# Patient Record
Sex: Female | Born: 1985 | Race: Asian | Hispanic: No | Marital: Married | Smoking: Never smoker
Health system: Southern US, Community
[De-identification: ages and names within clinical notes are randomized; demographics above are authoritative.]

## PROBLEM LIST (undated history)

## (undated) DIAGNOSIS — Z789 Other specified health status: Secondary | ICD-10-CM

## (undated) HISTORY — DX: Other specified health status: Z78.9

---

## 2016-07-23 NOTE — L&D Delivery Note (Signed)
Patient is a 31 y.o. now G2P2 s/p NSVD at 5222w3d, who was admitted for SOL and SROM.  She arrived in MAU complete and ruptured at unknown time per patient. Hx of C/S x1. She pushed to deliver in 25 minutes.  Cord clamping delayed by several minutes then clamped by CNM and cut by interpreter.  Placenta intact and spontaneous, bleeding large with clots- pitocin 10 units given IM and methergine 0.2mg  given IM. Clots removed by manual uterine extraction- Unasyn 3g ordered for prophylaxis.  2nd degree laceration repaired without difficulty.  Mom and baby stable prior to transfer to postpartum. She plans on breastfeeding. She is unsure for birth control.  Delivery Note At 4:32 PM a viable female was delivered via VBAC, Spontaneous (Presentation: LOA ).  APGAR: 9, 9; weight pending  .   Placenta delivered intact via Duncan, 3V Cord: with pitocin and methergine given for excessive bleeding. Placenta sent to Pathology   Anesthesia: Local lidocaine used for repair and IV fentanyl post delivery  Episiotomy: None Lacerations: 2nd degree;Perineal Suture Repair: 2.0 vicryl Est. Blood Loss (mL): 500  Mom to postpartum.  Baby to Couplet care / Skin to Skin.  Sharyon CableVeronica C Rogers CNM 06/25/2017, 5:17 PM

## 2017-06-25 ENCOUNTER — Inpatient Hospital Stay (HOSPITAL_COMMUNITY)
Admission: AD | Admit: 2017-06-25 | Discharge: 2017-06-27 | DRG: 807 | Disposition: A | Discharge status: Home / Self Care | Payer: Medicaid - Out of State | Source: Ambulatory Visit | Attending: Obstetrics and Gynecology | Admitting: Obstetrics and Gynecology

## 2017-06-25 ENCOUNTER — Other Ambulatory Visit: Payer: Self-pay

## 2017-06-25 ENCOUNTER — Encounter (HOSPITAL_COMMUNITY): Payer: Self-pay | Admitting: *Deleted

## 2017-06-25 DIAGNOSIS — Z3483 Encounter for supervision of other normal pregnancy, third trimester: Secondary | ICD-10-CM | POA: Diagnosis present

## 2017-06-25 DIAGNOSIS — Z3A36 36 weeks gestation of pregnancy: Secondary | ICD-10-CM

## 2017-06-25 DIAGNOSIS — O34219 Maternal care for unspecified type scar from previous cesarean delivery: Secondary | ICD-10-CM | POA: Diagnosis present

## 2017-06-25 LAB — CBC WITH DIFFERENTIAL/PLATELET
Basophils Absolute: 0 10*3/uL (ref 0.0–0.1)
Basophils Relative: 0 %
EOS ABS: 0 10*3/uL (ref 0.0–0.7)
Eosinophils Relative: 0 %
HEMATOCRIT: 42.5 % (ref 36.0–46.0)
HEMOGLOBIN: 14 g/dL (ref 12.0–15.0)
LYMPHS ABS: 1.7 10*3/uL (ref 0.7–4.0)
Lymphocytes Relative: 10 %
MCH: 33.1 pg (ref 26.0–34.0)
MCHC: 32.9 g/dL (ref 30.0–36.0)
MCV: 100.5 fL — AB (ref 78.0–100.0)
MONOS PCT: 3 %
Monocytes Absolute: 0.4 10*3/uL (ref 0.1–1.0)
Neutro Abs: 14.7 10*3/uL — ABNORMAL HIGH (ref 1.7–7.7)
Neutrophils Relative %: 87 %
Platelets: 212 10*3/uL (ref 150–400)
RBC: 4.23 MIL/uL (ref 3.87–5.11)
RDW: 13.6 % (ref 11.5–15.5)
WBC: 16.8 10*3/uL — AB (ref 4.0–10.5)

## 2017-06-25 LAB — RAPID HIV SCREEN (HIV 1/2 AB+AG)
HIV 1/2 Antibodies: NONREACTIVE
HIV-1 P24 Antigen - HIV24: NONREACTIVE

## 2017-06-25 LAB — HEPATITIS B SURFACE ANTIGEN: HEP B S AG: NEGATIVE

## 2017-06-25 LAB — TYPE AND SCREEN
ABO/RH(D): AB POS
ANTIBODY SCREEN: NEGATIVE

## 2017-06-25 LAB — ABO/RH: ABO/RH(D): AB POS

## 2017-06-25 MED ORDER — LACTATED RINGERS IV SOLN
INTRAVENOUS | Status: DC
  Administered 2017-06-25: 17:00:00 via INTRAVENOUS

## 2017-06-25 MED ORDER — COCONUT OIL OIL: 1.0000 "application " | TOPICAL_OIL | Status: DC | PRN

## 2017-06-25 MED ORDER — FENTANYL CITRATE (PF) 100 MCG/2ML IJ SOLN: 50.0000 ug | Freq: Once | INTRAMUSCULAR | Status: DC

## 2017-06-25 MED ORDER — SENNOSIDES-DOCUSATE SODIUM 8.6-50 MG PO TABS
2.0000 | ORAL_TABLET | ORAL | Status: DC
  Administered 2017-06-25 – 2017-06-26 (×2): 2 via ORAL
  Filled 2017-06-25 (×2): qty 2

## 2017-06-25 MED ORDER — OXYCODONE-ACETAMINOPHEN 5-325 MG PO TABS: 2.0000 | ORAL_TABLET | ORAL | Status: DC | PRN

## 2017-06-25 MED ORDER — SODIUM CHLORIDE 0.9 % IV SOLN
3.0000 g | Freq: Once | INTRAVENOUS | Status: AC
  Administered 2017-06-25: 3 g via INTRAVENOUS
  Filled 2017-06-25: qty 3

## 2017-06-25 MED ORDER — WITCH HAZEL-GLYCERIN EX PADS: 1.0000 "application " | MEDICATED_PAD | CUTANEOUS | Status: DC | PRN

## 2017-06-25 MED ORDER — ONDANSETRON HCL 4 MG/2ML IJ SOLN: 4.0000 mg | INTRAMUSCULAR | Status: DC | PRN

## 2017-06-25 MED ORDER — DIPHENHYDRAMINE HCL 25 MG PO CAPS: 25.0000 mg | ORAL_CAPSULE | Freq: Four times a day (QID) | ORAL | Status: DC | PRN

## 2017-06-25 MED ORDER — OXYTOCIN BOLUS FROM INFUSION: 500.0000 mL | Freq: Once | INTRAVENOUS | Status: DC

## 2017-06-25 MED ORDER — PRENATAL MULTIVITAMIN CH
1.0000 | ORAL_TABLET | Freq: Every day | ORAL | Status: DC
  Administered 2017-06-26: 1 via ORAL
  Filled 2017-06-25: qty 1

## 2017-06-25 MED ORDER — TETANUS-DIPHTH-ACELL PERTUSSIS 5-2.5-18.5 LF-MCG/0.5 IM SUSP
0.5000 mL | Freq: Once | INTRAMUSCULAR | Status: AC
  Administered 2017-06-26: 0.5 mL via INTRAMUSCULAR

## 2017-06-25 MED ORDER — ONDANSETRON HCL 4 MG PO TABS
4.0000 mg | ORAL_TABLET | ORAL | Status: DC | PRN
Start: 2017-06-25 — End: 2017-06-27

## 2017-06-25 MED ORDER — ACETAMINOPHEN 325 MG PO TABS: 650.0000 mg | ORAL_TABLET | ORAL | Status: DC | PRN

## 2017-06-25 MED ORDER — METHYLERGONOVINE MALEATE 0.2 MG PO TABS: 0.2000 mg | ORAL_TABLET | ORAL | Status: DC | PRN

## 2017-06-25 MED ORDER — LACTATED RINGERS IV SOLN: 500.0000 mL | INTRAVENOUS | Status: DC | PRN

## 2017-06-25 MED ORDER — LIDOCAINE HCL (PF) 1 % IJ SOLN
INTRAMUSCULAR | Status: AC
Start: 2017-06-25 — End: 2017-06-26
  Filled 2017-06-25: qty 30

## 2017-06-25 MED ORDER — ONDANSETRON HCL 4 MG/2ML IJ SOLN: 4.0000 mg | Freq: Four times a day (QID) | INTRAMUSCULAR | Status: DC | PRN

## 2017-06-25 MED ORDER — LIDOCAINE HCL (PF) 1 % IJ SOLN
30.0000 mL | INTRAMUSCULAR | Status: AC | PRN
  Administered 2017-06-25: 30 mL via SUBCUTANEOUS
  Filled 2017-06-25: qty 30

## 2017-06-25 MED ORDER — METHYLERGONOVINE MALEATE 0.2 MG/ML IJ SOLN: 0.2000 mg | INTRAMUSCULAR | Status: DC | PRN

## 2017-06-25 MED ORDER — ACETAMINOPHEN 325 MG PO TABS
650.0000 mg | ORAL_TABLET | ORAL | Status: DC | PRN
Start: 2017-06-25 — End: 2017-06-27

## 2017-06-25 MED ORDER — IBUPROFEN 600 MG PO TABS
600.0000 mg | ORAL_TABLET | Freq: Four times a day (QID) | ORAL | Status: DC
  Administered 2017-06-25 – 2017-06-27 (×6): 600 mg via ORAL
  Filled 2017-06-25 (×7): qty 1

## 2017-06-25 MED ORDER — OXYCODONE-ACETAMINOPHEN 5-325 MG PO TABS: 1.0000 | ORAL_TABLET | ORAL | Status: DC | PRN

## 2017-06-25 MED ORDER — FENTANYL CITRATE (PF) 100 MCG/2ML IJ SOLN
INTRAMUSCULAR | Status: AC
  Administered 2017-06-25: 100 ug
  Filled 2017-06-25: qty 2

## 2017-06-25 MED ORDER — OXYTOCIN 10 UNIT/ML IJ SOLN
INTRAMUSCULAR | Status: AC
  Administered 2017-06-25: 10 [IU]
  Filled 2017-06-25: qty 1

## 2017-06-25 MED ORDER — OXYTOCIN 40 UNITS IN LACTATED RINGERS INFUSION - SIMPLE MED: 2.5000 [IU]/h | INTRAVENOUS | Status: DC

## 2017-06-25 MED ORDER — SOD CITRATE-CITRIC ACID 500-334 MG/5ML PO SOLN: 30.0000 mL | ORAL | Status: DC | PRN

## 2017-06-25 MED ORDER — BENZOCAINE-MENTHOL 20-0.5 % EX AERO
1.0000 "application " | INHALATION_SPRAY | CUTANEOUS | Status: DC | PRN
  Administered 2017-06-26: 1 via TOPICAL
  Filled 2017-06-25: qty 56

## 2017-06-25 MED ORDER — ZOLPIDEM TARTRATE 5 MG PO TABS: 5.0000 mg | ORAL_TABLET | Freq: Every evening | ORAL | Status: DC | PRN

## 2017-06-25 MED ORDER — DIBUCAINE 1 % RE OINT: 1.0000 "application " | TOPICAL_OINTMENT | RECTAL | Status: DC | PRN

## 2017-06-25 MED ORDER — METHYLERGONOVINE MALEATE 0.2 MG/ML IJ SOLN
0.2000 mg | Freq: Once | INTRAMUSCULAR | Status: AC
  Administered 2017-06-25: 0.2 mg via INTRAMUSCULAR

## 2017-06-25 MED ORDER — SIMETHICONE 80 MG PO CHEW: 80.0000 mg | CHEWABLE_TABLET | ORAL | Status: DC | PRN

## 2017-06-25 NOTE — Progress Notes (Signed)
Steroids unable to be given prior to delivery due to precipitous labor and complete dilation in maternity admissions.

## 2017-06-25 NOTE — MAU Note (Signed)
Nurse called to the bathroom by patient's family member.  Pt. Bleeding.  RN escorted patient to LDR #5. Pt. Placed on EFM FHR 140s.  Per patient, due date is December 29th. Vaginal bleeding noted, RN [facility] - provider busy with other patient's and I was instructed to check patient.  SVE 100/10/-2. No provider available  (in rooms with other patients) provider overhead paged to LDR #5. While attempting to triage patient during her discomfort with labor pains, pt.'s first child was a c/section and pt[facility], pt. Travels back and forth to [place], pt. [place] not have a provider [place] in [place], [place].   [facility] and patient transferred to L&D.

## 2017-06-26 LAB — RAPID URINE DRUG SCREEN, HOSP PERFORMED
Amphetamines: NOT DETECTED
Barbiturates: NOT DETECTED
Benzodiazepines: NOT DETECTED
Cocaine: NOT DETECTED
Opiates: NOT DETECTED
Tetrahydrocannabinol: NOT DETECTED

## 2017-06-26 LAB — HIV ANTIBODY (ROUTINE TESTING W REFLEX): HIV SCREEN 4TH GENERATION: NONREACTIVE

## 2017-06-26 LAB — RPR: RPR: NONREACTIVE

## 2017-06-26 LAB — RUBELLA SCREEN: RUBELLA: 4.51 {index} (ref >0.99)

## 2017-06-26 NOTE — Progress Notes (Signed)
POSTPARTUM PROGRESS NOTE  Post Partum Day 1  Subjective:  Makayla Mcfarland is a 31 y.o. [id number] s/p VBAC at 3252w3d.  No acute events overnight.  Pt denies problems with ambulating, voiding or po intake.  She denies nausea or vomiting.  Pain is well controlled.  Lochia Small.   Objective: Blood pressure 100/63, pulse 72, temperature 98.2 F (36.8 C), temperature source Oral, resp. rate 20, height 5\' 1"  (1.549 m), weight 135 lb (61.2 kg), last menstrual period 06/25/2017, unknown if currently breastfeeding.  Physical Exam:  General: alert, cooperative and no distress Chest: no respiratory distress Heart: regular rate, distal pulses intact Abdomen: soft, nontender,  Uterine Fundus: firm, appropriately tender DVT Evaluation: No calf swelling or tenderness Extremities: no edema Skin: warm, dry  Recent Labs    06/25/17 1646  HGB 14.0  HCT 42.5    Assessment/Plan: Atha Cheri RousShan Schirtzinger is a 31 y.o. [id number] s/p VBAC at 952w3d   PPD#1 - Doing well Contraception: undecided; counseled Feeding: breast Dispo: Plan for discharge tomorrow.   LOS: 1 day   Kandra NicolasJulie P DegeleMD 06/26/2017, 10:17 AM

## 2017-06-27 MED ORDER — IBUPROFEN 600 MG PO TABS: 600.0000 mg | ORAL_TABLET | Freq: Four times a day (QID) | ORAL | 0 refills | Status: DC | PRN

## 2017-06-27 NOTE — H&P (Signed)
Makayla Mcfarland is an 31 y.o. [phone] 3036w3d female.   Chief Complaint: labor HPI: Here with contractions. Bleeding since 4 am. Pain began at 10 am. Brought to Triage and bleeding and in pain. She was complete and delivered quickly. Reports traveling frequently between [place] and here. No prenatal care. Previous c-section.  History reviewed. No pertinent past medical history.  Past Surgical History:  Procedure Laterality Date  . CESAREAN SECTION      History reviewed. No pertinent family history. Social History:  reports that  has never smoked. she has never used smokeless tobacco. She reports that she does not drink alcohol or use drugs.   No Known Allergies  No medications prior to admission.     Review of systems not obtained due to patient factors. Does not speak English  Blood pressure (!) 90/58, pulse 83, temperature 98 F (36.7 C), temperature source Oral, resp. rate 18, height 5\' 1"  (1.549 m), weight 138 lb (62.6 kg), last menstrual period 06/25/2017, unknown if currently breastfeeding. General appearance: alert, cooperative and appears stated age Head: Normocephalic, without obvious abnormality, atraumatic Neck: supple, symmetrical, trachea midline Lungs: normal effort Heart: regular rate and rhythm Abdomen: gravid, size equals dates Extremities: Homans sign is negative, no sign of DVT Skin: Skin color, texture, turgor normal. No rashes or lesions Neurologic: Grossly normal   Lab Results  Component Value Date   WBC 16.8 (H) 06/25/2017   HGB 14.0 06/25/2017   HCT 42.5 06/25/2017   MCV 100.5 (H) 06/25/2017   PLT 212 06/25/2017         ABO, Rh: --/--/AB POS, AB POS (12/04 1646)  Antibody: NEG (12/04 1646)  Rubella: 4.51 (12/04 1646)  RPR: Non Reactive (12/04 1646)  HBsAg: Negative (12/04 1646)  HIV:    GBS:       Assessment/Plan Patient Active Problem List   Diagnosis Date Noted  . Indication for care in labor or delivery 06/25/2017   Immediately delivered  see notes.  Makayla Mcfarland 06/27/2017, 4:40 PM

## 2017-06-27 NOTE — Discharge Summary (Signed)
OB Discharge Summary     Patient Name: Makayla Mcfarland DOB: Mar 20, 1986 MRN: [id number]  Date of admission: 06/25/2017 Delivering MD: Sharyon CableOGERS, VERONICA C   Date of discharge: 06/27/2017  Admitting diagnosis: 36wks CTX 5mons and bleeding in mornign 2 Intrauterine pregnancy: 2487w3d     Secondary diagnosis:  Laboring; prev C/S; preterm Additional problems: prenatal care in [place]     Discharge diagnosis: Preterm Pregnancy Delivered and VBAC                                                                                                Post partum procedures:none  Augmentation: none  Complications: None  Hospital course:  Onset of Labor With Vaginal Delivery     31 y.o. yo [id number] at 3787w3d was admitted in Active Labor on 06/25/2017. She was complete and pushing, so betamethasone unable to be given prior to delivery. Patient had a labor course remarkable for 500cc of clots delivered with placenta, requiring methergine and Pit IM, and also bimanual exam for clot removal. She received a dose of Unasyn for prophylaxis.  Membrane Rupture Time/Date:   ,06/25/2017   Intrapartum Procedures: Episiotomy: None [1]                                         Lacerations:  2nd degree [3];Perineal [11]  Patient had a delivery of a Viable infant. 06/25/2017  Information for the patient's newborn:  Ethlyn GalleryJiang, Girl Mi [[id number]][[id number]]  Delivery Method: VBAC, Spontaneous(Filed from Delivery Summary)    Pateint had an uncomplicated postpartum course.  She is ambulating, tolerating a regular diet, passing flatus, and urinating well. Patient is discharged home in stable condition on 06/27/17.   Physical exam  Vitals:   06/25/17 1930 06/26/17 0535 06/26/17 1849 06/27/17 0536  BP: 104/71 100/63 (!) 86/58 (!) 90/58  Pulse: 85 72 89 83  Resp: 18 20 20 18   Temp: 98.2 F (36.8 C)  98.3 F (36.8 C) 98 F (36.7 C)  TempSrc: Oral  Oral Oral  Weight:      Height:       General: alert and cooperative Lochia:  appropriate Uterine Fundus: firm DVT Evaluation: No evidence of DVT seen on physical exam. Labs: Lab Results  Component Value Date   WBC 16.8 (H) 06/25/2017   HGB 14.0 06/25/2017   HCT 42.5 06/25/2017   MCV 100.5 (H) 06/25/2017   PLT 212 06/25/2017   No flowsheet data found.  Discharge instruction: per After Visit Summary and "Baby and Me Booklet".  After visit meds:  Allergies as of 06/27/2017   No Known Allergies     Medication List    TAKE these medications   ibuprofen 600 MG tablet Commonly known as:  ADVIL,MOTRIN Take 1 tablet (600 mg total) by mouth every 6 (six) hours as needed.       Diet: routine diet  Activity: Advance as tolerated. Pelvic rest for 6 weeks.   Outpatient follow up:4 weeks- inbox msg sent requesting appt Follow up  Appt:No future appointments. Follow up Visit:No Follow-up on file.  Postpartum contraception: Condoms  Newborn Data: Live born female  Birth Weight: 5 lb 14.7 oz (2685 g) APGAR: 9, 9  Newborn Delivery   Birth date/time:  06/25/2017 16:32:00 Delivery type:  VBAC, Spontaneous     Baby Feeding: Breast Disposition:home with mother   06/27/2017 Cam HaiSHAW, KIMBERLY, CNM 9:44 AM

## 2017-06-27 NOTE — Discharge Instructions (Signed)

## 2017-07-09 ENCOUNTER — Encounter: Payer: Self-pay | Admitting: General Practice

## 2017-08-12 ENCOUNTER — Ambulatory Visit (INDEPENDENT_AMBULATORY_CARE_PROVIDER_SITE_OTHER): Payer: PRIVATE HEALTH INSURANCE | Admitting: Advanced Practice Midwife

## 2017-08-12 ENCOUNTER — Encounter: Payer: Self-pay | Admitting: Advanced Practice Midwife

## 2017-08-12 DIAGNOSIS — Z3009 Encounter for other general counseling and advice on contraception: Secondary | ICD-10-CM

## 2017-08-12 NOTE — Progress Notes (Signed)
Plans for birth control are condoms. States is doing well and denies any problems with anxiety/depression

## 2017-08-12 NOTE — Progress Notes (Signed)
Subjective:     Patient ID: Theo DillsSu Shan Lonsway, female   DOB: [birth year], 32 y.o.   MRN: [id number]  Interpretor used for visit.   Mykenzi Cheri RousShan Miner is a 32 y.o. G2P1102 who is SP VBAC on 06/25/17. She is bottle feeding. She was visiting from [place] and ended up having her baby here. She had prenatal care in [place]. Denies any perineal pain. She has not resumed intercourse at this time. She reports that her bleeding has stopped at this time. Babies course has been uncomplicated. She is planning to use condoms for contraception at this time.    Review of Systems  Constitutional: Negative for chills and fever.  Gastrointestinal: Negative for nausea and vomiting.  Genitourinary: Negative for pelvic pain and vaginal bleeding.       Objective:   Physical Exam  Constitutional: She is oriented to person, place, and time. She appears well-developed and well-nourished. No distress.  HENT:  Head: Normocephalic.  Cardiovascular: Normal rate.  Pulmonary/Chest: Effort normal.  Abdominal: Soft. There is no tenderness. There is no rebound.  Neurological: She is alert and oriented to person, place, and time.  Skin: Skin is warm and dry.  Psychiatric: She has a normal mood and affect.  Nursing note and vitals reviewed.      Assessment:     1. Postpartum care and examination   2. Birth control counseling        Plan:     Reviewed birth control options Not interested at this time Routine care  Thressa ShellerHeather Hernando Reali 2:38 PM 08/12/17

## 2020-07-23 NOTE — L&D Delivery Note (Addendum)
LABOR COURSE Patient is a 35 year old female G3P1102 with IUP at [redacted]w[redacted]d by ultrasound who presented with contractions for SOL. She was found to be in active labor on admission and had an uncomplicated labor course as follows: declined epidural; did not require any augmentation of labor and progressed to complete at 1356 with SROM just prior to completion.   Delivery Note Called to room and patient was complete and pushing. Head delivered spontaneously. No nuchal cord present. Shoulder and body delivered in usual fashion. At 1401 a viable and healthy female was delivered via Vaginal, Spontaneous VBAC (Presentation: vertex;  position ROA).  Infant with spontaneous cry, placed on mother's abdomen, dried and stimulated. Cord clamped x 2 after 1.5-minute delay, and cut by provider. Cord blood drawn. Placenta delivered spontaneously with gentle cord traction. Appears intact. Fundus firm with massage and Pitocin. Labia, perineum, vagina, and cervix inspected with second degree perineal laceration noted.    APGAR: 8, 9; weight pending.   Cord: 3VC with the following complications: short.   Cord pH: n/a  Delivery performed with Dr. Salvadore Dom, DO under supervision of Cam Hai, CNM.  Anesthesia: Lidocaine  Episiotomy: None Lacerations: 2nd degree perineal Suture Repair: 3.0 vicryl Est. Blood Loss (mL): 150 mL  Repair performed by Dr. Salvadore Dom, DO with my assistance under supervision of Cam Hai, CNM.  Mom to postpartum.  Baby to Couplet care / Skin to Skin.   [name], MD 05/16/21 3:03 PM    Patient is a [id number] at [redacted]w[redacted]d who was admitted in SOL, significant hx of prev C/S with subsequent VBAC but otherwise uncomplicated prenatal course.  She progressed without augmentation.  I was gloved and present for delivery in its entirety.  Second stage of labor progressed, baby delivered after pushing x 5 mins.  No decels during second stage noted.  Complications: none  Lacerations: 2nd  deg perineal  EBL: 150cc  Makayla Mcfarland, CNM 6:32 PM 05/16/2021

## 2020-09-26 ENCOUNTER — Ambulatory Visit (INDEPENDENT_AMBULATORY_CARE_PROVIDER_SITE_OTHER): Payer: Medicaid Other | Admitting: *Deleted

## 2020-09-26 ENCOUNTER — Other Ambulatory Visit: Payer: Self-pay

## 2020-09-26 ENCOUNTER — Inpatient Hospital Stay (HOSPITAL_COMMUNITY)
Admission: AD | Admit: 2020-09-26 | Discharge: 2020-09-26 | Disposition: A | Discharge status: Home / Self Care | Payer: Medicaid Other | Attending: Family Medicine | Admitting: Family Medicine

## 2020-09-26 DIAGNOSIS — Z711 Person with feared health complaint in whom no diagnosis is made: Secondary | ICD-10-CM

## 2020-09-26 DIAGNOSIS — Z3200 Encounter for pregnancy test, result unknown: Secondary | ICD-10-CM | POA: Diagnosis not present

## 2020-09-26 DIAGNOSIS — Z049 Encounter for examination and observation for unspecified reason: Secondary | ICD-10-CM | POA: Diagnosis not present

## 2020-09-26 LAB — POCT PREGNANCY, URINE: Preg Test, Ur: POSITIVE — AB

## 2020-09-26 NOTE — Progress Notes (Addendum)
Pt submitted urine for pregnancy test and requested to be called with results. I called pt an informed her of +UPT. She reports LMP 07/30/20 which yields EDD 05/06/21, now [redacted]w[redacted]d. Pt was advised to start taking prenatal vitamins. She will be contacted with prenatal care appointments which will begin in 2-4 weeks. She should go to MAU if she develops heavy vaginal bleeding or abdominal pain. Pt voiced understanding of information and instructions given.   Nolene Bernheim, RN, MSN, NP-BC Nurse Practitioner, [facility] for [employer], [facility] 09/26/2020 5:43 PM

## 2020-09-26 NOTE — MAU Note (Signed)
Presents requesting pregnancy confirmation and wanting to know due date.  Reports had +HPT.  Denies VB.  LMP 07/30/2020.

## 2020-09-26 NOTE — MAU Provider Note (Signed)
Event Date/Time  First Provider Initiated Contact with Patient 09/26/20 1020     S Ms. Makayla Mcfarland is a 35 y.o. [phone] patient who presents to MAU today for pregnancy confirmation and to request her due date. She endorses one negative home pregnancy test and one positive home pregnancy test. She denies abdominal pain, vaginal bleeding, dysuria, fever or recent illness.  O BP 103/70 (BP Location: Right Arm)   Pulse 95   Temp 98.1 F (36.7 C) (Oral)   Resp 20   Wt 54.4 kg   LMP 07/30/2020   SpO2 99%   BMI 22.67 kg/m    Physical Exam Vitals and nursing note reviewed. Exam conducted with a chaperone present.  Constitutional:      Appearance: Normal appearance.  Cardiovascular:     Rate and Rhythm: Normal rate.     Pulses: Normal pulses.  Pulmonary:     Effort: Pulmonary effort is normal.  Abdominal:     General: Abdomen is flat.  Skin:    Capillary Refill: Capillary refill takes less than 2 seconds.  Neurological:     Mental Status: She is alert and oriented to person, place, and time.  Psychiatric:        Mood and Affect: Mood normal.        Behavior: Behavior normal.        Thought Content: Thought content normal.        Judgment: Judgment normal.    A Medical screening exam complete No acute obstetric concerns Discussed indications for pregnancy confirmation in MAU, not present at this time  P Discharge from MAU in stable condition Address for two closest [facility] offices put into patient's mapping app, patient may present for walk-in Warning signs for worsening condition that would warrant emergency follow-up discussed Patient may return to MAU as needed   Calvert Cantor, [place] 09/26/2020 10:32 AM

## 2020-10-06 ENCOUNTER — Telehealth (INDEPENDENT_AMBULATORY_CARE_PROVIDER_SITE_OTHER): Payer: Medicaid Other

## 2020-10-06 DIAGNOSIS — Z3480 Encounter for supervision of other normal pregnancy, unspecified trimester: Secondary | ICD-10-CM | POA: Insufficient documentation

## 2020-10-06 MED ORDER — BLOOD PRESSURE MONITORING DEVI: 1.0000 | 0 refills | Status: DC

## 2020-10-06 NOTE — Progress Notes (Signed)
New OB Intake  I connected with  Makayla Mcfarland on 10/06/20 at  2:15 PM EDT by telephone and verified that I am speaking with the correct person using two identifiers. Nurse is located at [facility] and pt is located at home.  I discussed the limitations, risks, security and privacy concerns of performing an evaluation and management service by telephone and the availability of in person appointments. I also discussed with the patient that there may be a patient responsible charge related to this service. The patient expressed understanding and agreed to proceed.  I explained I am completing New OB Intake today. We discussed her EDD of 05/06/21 that is based on LMP of 07/30/20. Pt is G3/P1. I reviewed her allergies, medications, Medical/Surgical/OB history, and appropriate screenings. I informed her of [facility] services. Based on history, this is a/an uncomplicated pregnancy.  Patient Active Problem List   Diagnosis Date Noted  . Indication for care in labor or delivery 06/25/2017     Concerns addressed today  Delivery Plans:  Plans to deliver at [facility].   MyChart/Babyscripts MyChart access verified. I explained pt will have some visits in office and some virtually. Babyscripts instructions given. Account successfully created and app downloaded.  Blood Pressure Cuff Blood pressure cuff ordered for patient to pick-up from [employer]. Explained after first prenatal appt pt will check weekly and document in Babyscripts.  Anatomy US Explained first scheduled [place] will be around 19 weeks. Anatomy US scheduled for 12/12/20 at 8:30a.   Labs Discussed Avelina Laine genetic screening with patient. Would like both Panorama and Horizon drawn at new OB visit. Routine prenatal labs needed.  Covid Vaccine Patient has had the covid vaccine.   [facility] Referral Patient is interested in referral to [facility].    First visit review I reviewed new OB appt with pt. I explained she will have a pelvic exam, ob bloodwork  with genetic screening, and PAP smear. Explained pt will be seen by Dr. Mart Piggs on 10/17/20@ 8:55a at first visit; encounter routed to appropriate provider. If new patient offered monthly Zoom meeting  Makayla Mcfarland, [facility] 10/06/2020  2:32 PM

## 2020-10-06 NOTE — Progress Notes (Signed)
New OB Intake done by telephone via Caregility using Mandarin Interprter Toni id# [phone]. Pt could not log into My Chart.

## 2020-10-17 ENCOUNTER — Encounter: Payer: Self-pay | Admitting: Family Medicine

## 2020-10-17 ENCOUNTER — Ambulatory Visit (INDEPENDENT_AMBULATORY_CARE_PROVIDER_SITE_OTHER): Payer: Medicaid Other | Admitting: Family Medicine

## 2020-10-17 ENCOUNTER — Other Ambulatory Visit (HOSPITAL_COMMUNITY)
Admission: RE | Admit: 2020-10-17 | Discharge: 2020-10-17 | Disposition: A | Discharge status: Home / Self Care | Payer: Medicaid Other | Source: Ambulatory Visit | Attending: Family Medicine | Admitting: Family Medicine

## 2020-10-17 ENCOUNTER — Other Ambulatory Visit: Payer: Self-pay

## 2020-10-17 DIAGNOSIS — Z23 Encounter for immunization: Secondary | ICD-10-CM

## 2020-10-17 DIAGNOSIS — Z98891 History of uterine scar from previous surgery: Secondary | ICD-10-CM | POA: Diagnosis not present

## 2020-10-17 DIAGNOSIS — Z789 Other specified health status: Secondary | ICD-10-CM | POA: Diagnosis not present

## 2020-10-17 DIAGNOSIS — Z3480 Encounter for supervision of other normal pregnancy, unspecified trimester: Secondary | ICD-10-CM | POA: Insufficient documentation

## 2020-10-17 NOTE — Progress Notes (Signed)
History:   Makayla Mcfarland is a 35 y.o. [id number] at [redacted]w[redacted]d by LMP being seen today for her first obstetrical visit.  Her obstetrical history is significant for history of cesarean section. Patient does not intend to breast feed. Pregnancy history fully reviewed.  Patient reports no complaints.      HISTORY: OB History  Gravida Para Term Preterm AB Living  3 2 1 1  0 2  SAB IAB Ectopic Multiple Live Births  0 0 0 0 2    # Outcome Date GA Lbr Len/2nd Weight Sex Delivery Anes PTL Lv  3 Current           2 Preterm 06/25/17 [redacted]w[redacted]d 05:57 / 00:35 5 lb 14.7 oz (2.685 kg) F VBAC None  LIV     Name: Makayla Mcfarland     Apgar1: 9  Apgar5: 9  1 Term 06/26/12     CS-Classical       Last pap smear was done unknown  History reviewed. No pertinent past medical history. Past Surgical History:  Procedure Laterality Date  . CESAREAN SECTION     History reviewed. No pertinent family history. Social History   Tobacco Use  . Smoking status: Never Smoker  . Smokeless tobacco: Never Used  Substance Use Topics  . Alcohol use: No  . Drug use: No   No Known Allergies Current Outpatient Medications on File Prior to Visit  Medication Sig Dispense Refill  . Prenatal Vit-Fe Fumarate-FA (PRENATAL MULTIVITAMIN) TABS tablet Take 1 tablet by mouth daily at 12 noon.    . Blood Pressure Monitoring DEVI 1 each by Does not apply route once a week. (Patient not taking: Reported on 10/17/2020) 1 each 0   No current facility-administered medications on file prior to visit.    Review of Systems Pertinent items noted in HPI and remainder of comprehensive ROS otherwise negative. Physical Exam:   Vitals:   10/17/20 0903  BP: 107/74  Pulse: 88  Weight: 119 lb 14.4 oz (54.4 kg)     Bedside Ultrasound for FHR check: Viable intrauterine pregnancy with positive cardiac activity note, fetal heart rate 145 bpm Patient informed that the ultrasound is considered a limited obstetric ultrasound and is not intended to  be a complete ultrasound exam.  Patient also informed that the ultrasound is not being completed with the intent of assessing for fetal or placental anomalies or any pelvic abnormalities.  Explained that the purpose of today's ultrasound is to assess for fetal heart rate.  Patient acknowledges the purpose of the exam and the limitations of the study. Uterus:     Pelvic Exam: Perineum: no hemorrhoids, normal perineum   Vulva: normal external genitalia, no lesions   Vagina:  normal mucosa, normal discharge   Cervix: no lesions and normal, pap smear done.    Adnexa: normal adnexa and no mass, fullness, tenderness   Bony Pelvis: average  System: General: well-developed, well-nourished female in no acute distress   Breasts:  normal appearance, no masses or tenderness bilaterally   Skin: normal coloration and turgor, no rashes   Neurologic: oriented, normal, negative, normal mood   Extremities: normal strength, tone, and muscle mass, ROM of all joints is normal   HEENT PERRLA, extraocular movement intact and sclera clear, anicteric   Mouth/Teeth mucous membranes moist, pharynx normal without lesions and dental hygiene good   Neck supple and no masses   Cardiovascular: regular rate and rhythm   Respiratory:  no respiratory distress, normal breath sounds  Abdomen: soft, non-tender; bowel sounds normal; no masses,  no organomegaly    Assessment:    Pregnancy: [id number] Patient Active Problem List   Diagnosis Date Noted  . Language barrier 10/17/2020  . History of cesarean section 10/17/2020  . Supervision of other normal pregnancy, antepartum 10/06/2020     Plan:    1. Supervision of other normal pregnancy, antepartum -doing well without complaints -taking PNV -counseled on contraception, patient will think about options  2. Language barrier In person interpreter used for entirety of visit  3. History of cesarean section 2013 in [place], unsure of hospital name/location, unable to obtain  records. In Epic GsPs has been inputted as classical cesarean, patient denies having been told she could not TOLAC. Patient has had a successful VBAC. After discussion with Dr. Alysia Penna patient is cleared to [facility], needs consents signed.   Initial labs drawn. Continue prenatal vitamins. Problem list reviewed and updated. Genetic Screening discussed, NIPS: requested. Ultrasound discussed; fetal anatomic survey: ordered. Anticipatory guidance about prenatal visits given including labs, ultrasounds, and testing. Discussed usage of Babyscripts and virtual visits as additional source of managing and completing prenatal visits in midst of coronavirus and pandemic.   Encouraged to complete MyChart Registration for her ability to review results, send requests, and have questions addressed.  The nature of [place] [facility] Healthcare/Faculty Practice with multiple MDs and Advanced Practice Providers was explained to patient; also emphasized that residents, students are part of our team. Routine obstetric precautions reviewed. Encouraged to seek out care at office or emergency room [facility] MAU preferred) for urgent and/or emergent concerns. Return in about 4 weeks (around 11/14/2020) for LROB; in person.     Alric Seton, MD OB Fellow, Faculty [facility], Center for [facility] 10/17/2020 1:04 PM

## 2020-10-18 LAB — CBC/D/PLT+RPR+RH+ABO+RUB AB...
Antibody Screen: NEGATIVE
Basophils Absolute: 0.1 10*3/uL (ref 0.0–0.2)
Basos: 1 %
EOS (ABSOLUTE): 0.1 10*3/uL (ref 0.0–0.4)
Eos: 1 %
HCV Ab: <0.1 s/co ratio (ref 0.0–0.9)
HIV Screen 4th Generation wRfx: NONREACTIVE
Hematocrit: 38.5 % (ref 34.0–46.6)
Hemoglobin: 13.2 g/dL (ref 11.1–15.9)
Hepatitis B Surface Ag: NEGATIVE
Immature Grans (Abs): 0 10*3/uL (ref 0.0–0.1)
Immature Granulocytes: 0 %
Lymphocytes Absolute: 2.1 10*3/uL (ref 0.7–3.1)
Lymphs: 25 %
MCH: 32.3 pg (ref 26.6–33.0)
MCHC: 34.3 g/dL (ref 31.5–35.7)
MCV: 94 fL (ref 79–97)
Monocytes Absolute: 0.6 10*3/uL (ref 0.1–0.9)
Monocytes: 7 %
Neutrophils Absolute: 5.8 10*3/uL (ref 1.4–7.0)
Neutrophils: 66 %
Platelets: 251 10*3/uL (ref 150–450)
RBC: 4.09 x10E6/uL (ref 3.77–5.28)
RDW: 12 % (ref 11.7–15.4)
RPR Ser Ql: NONREACTIVE
Rh Factor: POSITIVE
Rubella Antibodies, IGG: 5.49 index (ref >0.99)
WBC: 8.5 10*3/uL (ref 3.4–10.8)

## 2020-10-18 LAB — HEMOGLOBIN A1C
Est. average glucose Bld gHb Est-mCnc: 103 mg/dL
Hgb A1c MFr Bld: 5.2 % (ref 4.8–5.6)

## 2020-10-18 LAB — HCV INTERPRETATION

## 2020-10-19 LAB — CYTOLOGY - PAP
Chlamydia: NEGATIVE
Comment: NEGATIVE
Comment: NEGATIVE
Comment: NORMAL
Diagnosis: NEGATIVE
High risk HPV: NEGATIVE
Neisseria Gonorrhea: NEGATIVE

## 2020-10-20 LAB — URINE CULTURE, OB REFLEX

## 2020-10-20 LAB — CULTURE, OB URINE

## 2020-10-31 ENCOUNTER — Encounter: Payer: Self-pay | Admitting: *Deleted

## 2020-11-01 ENCOUNTER — Encounter: Payer: Self-pay | Admitting: General Practice

## 2020-11-03 DIAGNOSIS — Z3480 Encounter for supervision of other normal pregnancy, unspecified trimester: Secondary | ICD-10-CM | POA: Diagnosis not present

## 2020-11-14 ENCOUNTER — Encounter: Payer: Medicaid Other | Admitting: Nurse Practitioner

## 2020-11-28 ENCOUNTER — Ambulatory Visit (INDEPENDENT_AMBULATORY_CARE_PROVIDER_SITE_OTHER): Payer: Medicaid Other | Admitting: Nurse Practitioner

## 2020-11-28 ENCOUNTER — Other Ambulatory Visit: Payer: Self-pay

## 2020-11-28 VITALS — BP 100/69 | HR 91 | Wt 121.5 lb

## 2020-11-28 DIAGNOSIS — Z3480 Encounter for supervision of other normal pregnancy, unspecified trimester: Secondary | ICD-10-CM | POA: Diagnosis not present

## 2020-11-28 DIAGNOSIS — Z98891 History of uterine scar from previous surgery: Secondary | ICD-10-CM

## 2020-11-28 DIAGNOSIS — Z789 Other specified health status: Secondary | ICD-10-CM

## 2020-11-28 DIAGNOSIS — Z3A17 17 weeks gestation of pregnancy: Secondary | ICD-10-CM

## 2020-11-28 NOTE — Progress Notes (Signed)
    Subjective:  Makayla Mcfarland is a 35 y.o. [phone] at [redacted]w[redacted]d being seen today for ongoing prenatal care.  She is currently monitored for the following issues for this low-risk pregnancy and has Supervision of other normal pregnancy, antepartum; Language barrier; and History of cesarean section on their problem list.  Patient reports no complaints.  Contractions: Not present. Vag. Bleeding: None.  Movement: Present. Denies leaking of fluid.   The following portions of the patient's history were reviewed and updated as appropriate: allergies, current medications, past family history, past medical history, past social history, past surgical history and problem list. Problem list updated.  Objective:   Vitals:   11/28/20 1554  BP: 100/69  Pulse: 91  Weight: 121 lb 8 oz (55.1 kg)    Fetal Status: Fetal Heart Rate (bpm): 157 Fundal Height: 17 cm Movement: Present     General:  Alert, oriented and cooperative. Patient is in no acute distress.  Skin: Skin is warm and dry. No rash noted.   Cardiovascular: Normal heart rate noted  Respiratory: Normal respiratory effort, no problems with respiration noted  Abdomen: Soft, gravid, appropriate for gestational age. Pain/Pressure: Absent     Pelvic:  Cervical exam deferred        Extremities: Normal range of motion.  Edema: None  Mental Status: Normal mood and affect. Normal behavior. Normal judgment and thought content.   Urinalysis:      Assessment and Plan:  Pregnancy: G3P1102 at [redacted]w[redacted]d  1. Supervision of other normal pregnancy, antepartum Doing well Reviewed [place] appt on 12-12-20  - AFP, Serum, Open Spina Bifida  2. Language barrier In person interpreter accompanies her for the entire visit  3. History of cesarean section    Preterm labor symptoms and general obstetric precautions including but not limited to vaginal bleeding, contractions, leaking of fluid and fetal movement were reviewed in detail with the patient. Please refer to  After Visit Summary for other counseling recommendations.  Return in about 4 weeks (around 12/26/2020) for in person ROB.  Nolene Bernheim, RN, MSN, NP-BC Nurse Practitioner, [facility] for [employer], [facility] 11/28/2020 8:41 PM

## 2020-11-30 LAB — AFP, SERUM, OPEN SPINA BIFIDA
AFP MoM: 0.45
AFP Value: 21.4 ng/mL
Gest. Age on Collection Date: 17.2 weeks
Maternal Age At EDD: 33.1 yr
OSBR Risk 1 IN: 10000
Test Results:: NEGATIVE
Weight: 121 [lb_av]

## 2020-12-12 ENCOUNTER — Other Ambulatory Visit: Payer: Self-pay

## 2020-12-12 ENCOUNTER — Other Ambulatory Visit: Payer: Self-pay | Admitting: Obstetrics and Gynecology

## 2020-12-12 ENCOUNTER — Ambulatory Visit: Payer: Medicaid Other | Attending: Obstetrics and Gynecology

## 2020-12-12 DIAGNOSIS — Z363 Encounter for antenatal screening for malformations: Secondary | ICD-10-CM

## 2020-12-12 DIAGNOSIS — O34219 Maternal care for unspecified type scar from previous cesarean delivery: Secondary | ICD-10-CM

## 2020-12-12 DIAGNOSIS — Z3A16 16 weeks gestation of pregnancy: Secondary | ICD-10-CM | POA: Diagnosis not present

## 2020-12-12 DIAGNOSIS — O09212 Supervision of pregnancy with history of pre-term labor, second trimester: Secondary | ICD-10-CM | POA: Diagnosis not present

## 2020-12-12 DIAGNOSIS — Z3480 Encounter for supervision of other normal pregnancy, unspecified trimester: Secondary | ICD-10-CM | POA: Insufficient documentation

## 2020-12-12 DIAGNOSIS — O359XX0 Maternal care for (suspected) fetal abnormality and damage, unspecified, not applicable or unspecified: Secondary | ICD-10-CM

## 2020-12-26 ENCOUNTER — Ambulatory Visit (INDEPENDENT_AMBULATORY_CARE_PROVIDER_SITE_OTHER): Payer: Medicaid Other | Admitting: Medical

## 2020-12-26 ENCOUNTER — Encounter: Payer: Self-pay | Admitting: Medical

## 2020-12-26 ENCOUNTER — Other Ambulatory Visit: Payer: Self-pay

## 2020-12-26 VITALS — BP 94/67 | HR 84 | Wt 126.4 lb

## 2020-12-26 DIAGNOSIS — Z3480 Encounter for supervision of other normal pregnancy, unspecified trimester: Secondary | ICD-10-CM

## 2020-12-26 DIAGNOSIS — Z3A18 18 weeks gestation of pregnancy: Secondary | ICD-10-CM

## 2020-12-26 DIAGNOSIS — Z98891 History of uterine scar from previous surgery: Secondary | ICD-10-CM

## 2020-12-26 DIAGNOSIS — Z789 Other specified health status: Secondary | ICD-10-CM

## 2020-12-26 NOTE — Patient Instructions (Addendum)
BP cuff is at [employer] at [employer]. Their phone number is : [phone]   Second Trimester of Pregnancy  The second trimester of pregnancy is from week 13 through week 27. This is also called months 4 through 6 of pregnancy. This is often the time when you feel your best. During the second trimester:  Morning sickness is less or has stopped.  You may have more energy.  You may feel hungry more often. At this time, your unborn baby (fetus) is growing very fast. At the end of the sixth month, the unborn baby may be up to 12 inches long and weigh about 1 pounds. You will likely start to feel the baby move between 16 and 20 weeks of pregnancy. Body changes during your second trimester Your body continues to go through many changes during this time. The changes vary and generally return to normal after the baby is born. Physical changes  You will gain more weight.  You may start to get stretch marks on your hips, belly (abdomen), and breasts.  Your breasts will grow and may hurt.  Dark spots or blotches may develop on your face.  A dark line from your belly button to the pubic area (linea nigra) may appear.  You may have changes in your hair. Health changes  You may have headaches.  You may have heartburn.  You may have trouble pooping (constipation).  You may have hemorrhoids or swollen, bulging veins (varicose veins).  Your gums may bleed.  You may pee (urinate) more often.  You may have back pain. Follow these instructions at home: Medicines  Take over-the-counter and prescription medicines only as told by your doctor. Some medicines are not safe during pregnancy.  Take a prenatal vitamin that contains at least 600 micrograms (mcg) of folic acid. Eating and drinking  Eat healthy meals that include: ? Fresh fruits and vegetables. ? Whole grains. ? Good sources of protein, such as meat, eggs, or tofu. ? Low-fat dairy products.  Avoid raw meat  and unpasteurized juice, milk, and cheese.  You may need to take these actions to prevent or treat trouble pooping: ? Drink enough fluids to keep your pee (urine) pale yellow. ? Eat foods that are high in fiber. These include beans, whole grains, and fresh fruits and vegetables. ? Limit foods that are high in fat and sugar. These include fried or sweet foods. Activity  Exercise only as told by your doctor. Most people can do their usual exercise during pregnancy. Try to exercise for 30 minutes at least 5 days a week.  Stop exercising if you have pain or cramps in your belly or lower back.  Do not exercise if it is too hot or too humid, or if you are in a place of great height (high altitude).  Avoid heavy lifting.  If you choose to, you may have sex unless your doctor tells you not to. Relieving pain and discomfort  Wear a good support bra if your breasts are sore.  Take warm water baths (sitz baths) to soothe pain or discomfort caused by hemorrhoids. Use hemorrhoid cream if your doctor approves.  Rest with your legs raised (elevated) if you have leg cramps or low back pain.  If you develop bulging veins in your legs: ? Wear support hose as told by your doctor. ? Raise your feet for 15 minutes, 3-4 times a day. ? Limit salt in your food. Safety  Wear your seat belt at all  times when you are in a car.  Talk with your doctor if someone is hurting you or yelling at you a lot. Lifestyle  Do not use hot tubs, steam rooms, or saunas.  Do not douche. Do not use tampons or scented sanitary pads.  Avoid cat litter boxes and soil used by cats. These carry germs that can harm your baby and can cause a loss of your baby by miscarriage or stillbirth.  Do not use herbal medicines, illegal drugs, or medicines that are not approved by your doctor. Do not drink alcohol.  Do not smoke or use any products that contain nicotine or tobacco. If you need help quitting, ask your doctor. General  instructions  Keep all follow-up visits. This is important.  Ask your doctor about local prenatal classes.  Ask your doctor about the right foods to eat or for help finding a counselor. Where to find more information  American Pregnancy Association: americanpregnancy.org  [employer] of Obstetricians and Gynecologists: www.acog.org  Office on [employer] Health: MightyReward.co.nz Contact a doctor if:  You have a headache that does not go away when you take medicine.  You have changes in how you see, or you see spots in front of your eyes.  You have mild cramps, pressure, or pain in your lower belly.  You continue to feel like you may vomit (nauseous), you vomit, or you have watery poop (diarrhea).  You have bad-smelling fluid coming from your vagina.  You have pain when you pee or your pee smells bad.  You have very bad swelling of your face, hands, ankles, feet, or legs.  You have a fever. Get help right away if:  You are leaking fluid from your vagina.  You have spotting or bleeding from your vagina.  You have very bad belly cramping or pain.  You have trouble breathing.  You have chest pain.  You faint.  You have not felt your baby move for the time period told by your doctor.  You have new or increased pain, swelling, or redness in an arm or leg. Summary  The second trimester of pregnancy is from week 13 through week 27 (months 4 through 6).  Eat healthy meals.  Exercise as told by your doctor. Most people can do their usual exercise during pregnancy.  Do not use herbal medicines, illegal drugs, or medicines that are not approved by your doctor. Do not drink alcohol.  Call your doctor if you get sick or if you notice anything unusual about your pregnancy. This information is not intended to replace advice given to you by your health care provider. Make sure you discuss any questions you have with your health care provider. Document Revised:  12/16/2019 Document Reviewed: 10/22/2019 Elsevier Patient Education  2021 [employer].

## 2020-12-26 NOTE — Progress Notes (Signed)
   PRENATAL VISIT NOTE  Subjective:  Makayla Mcfarland is a 35 y.o. [id number] at [redacted]w[redacted]d being seen today for ongoing prenatal care.  She is currently monitored for the following issues for this high-risk pregnancy and has Supervision of other normal pregnancy, antepartum; Language barrier; and History of cesarean section on their problem list.  Patient reports intermittent perineal swelling.  Contractions: Not present. Vag. Bleeding: None.  Movement: Present. Denies leaking of fluid.   The following portions of the patient's history were reviewed and updated as appropriate: allergies, current medications, past family history, past medical history, past social history, past surgical history and problem list.   Objective:   Vitals:   12/26/20 1535  BP: 94/67  Pulse: 84  Weight: 126 lb 6.4 oz (57.3 kg)    Fetal Status: Fetal Heart Rate (bpm): 154   Movement: Present     General:  Alert, oriented and cooperative. Patient is in no acute distress.  Skin: Skin is warm and dry. No rash noted.   Cardiovascular: Normal heart rate noted  Respiratory: Normal respiratory effort, no problems with respiration noted  Abdomen: Soft, gravid, appropriate for gestational age.  Pain/Pressure: Present     Pelvic: Cervical exam deferred        Extremities: Normal range of motion.  Edema: None  Mental Status: Normal mood and affect. Normal behavior. Normal judgment and thought content.   Assessment and Plan:  Pregnancy: G3P1102 at [redacted]w[redacted]d 1. Supervision of other normal pregnancy, antepartum - All labs up to date and discussed with patient  - Anatomy US scheduled 01/09/21  2. Language barrier - Interpreter present   3. History of cesarean section - Desires VBAC, will consent at 28 weeks   4. [redacted] weeks gestation of pregnancy  Preterm labor symptoms and general obstetric precautions including but not limited to vaginal bleeding, contractions, leaking of fluid and fetal movement were reviewed in detail with the  patient. Please refer to After Visit Summary for other counseling recommendations.   Return in about 4 weeks (around 01/23/2021) for LOB, In-Person, any provider.  Future Appointments  Date Time Provider Department Center  01/09/2021  2:30 PM [facility] NURSE [facility]  01/09/2021  2:45 PM [facility]4 [facility]  01/30/2021  1:15 PM Kathlene Cote [facility]    Vonzella Nipple, PA-C

## 2020-12-26 NOTE — Progress Notes (Signed)
Here for ob visit. Has not picked up bp cuff. States she did not know she was supposed to but will check with [facility].  C/o edema in perineal area. Legrand Como

## 2021-01-09 ENCOUNTER — Ambulatory Visit: Payer: Medicaid Other | Admitting: *Deleted

## 2021-01-09 ENCOUNTER — Other Ambulatory Visit: Payer: Self-pay

## 2021-01-09 ENCOUNTER — Ambulatory Visit: Payer: Medicaid Other | Attending: Obstetrics and Gynecology

## 2021-01-09 ENCOUNTER — Encounter: Payer: Self-pay | Admitting: *Deleted

## 2021-01-09 VITALS — BP 103/66 | HR 85

## 2021-01-09 DIAGNOSIS — O09212 Supervision of pregnancy with history of pre-term labor, second trimester: Secondary | ICD-10-CM | POA: Diagnosis not present

## 2021-01-09 DIAGNOSIS — Z3480 Encounter for supervision of other normal pregnancy, unspecified trimester: Secondary | ICD-10-CM | POA: Diagnosis present

## 2021-01-09 DIAGNOSIS — O34219 Maternal care for unspecified type scar from previous cesarean delivery: Secondary | ICD-10-CM | POA: Insufficient documentation

## 2021-01-09 DIAGNOSIS — O359XX0 Maternal care for (suspected) fetal abnormality and damage, unspecified, not applicable or unspecified: Secondary | ICD-10-CM | POA: Insufficient documentation

## 2021-01-10 ENCOUNTER — Other Ambulatory Visit: Payer: Self-pay | Admitting: *Deleted

## 2021-01-10 DIAGNOSIS — Z98891 History of uterine scar from previous surgery: Secondary | ICD-10-CM

## 2021-01-30 ENCOUNTER — Ambulatory Visit (INDEPENDENT_AMBULATORY_CARE_PROVIDER_SITE_OTHER): Payer: Medicaid Other | Admitting: Medical

## 2021-01-30 ENCOUNTER — Other Ambulatory Visit: Payer: Self-pay

## 2021-01-30 ENCOUNTER — Encounter: Payer: Self-pay | Admitting: Medical

## 2021-01-30 VITALS — BP 102/69 | HR 90 | Wt 135.6 lb

## 2021-01-30 DIAGNOSIS — Z3A23 23 weeks gestation of pregnancy: Secondary | ICD-10-CM

## 2021-01-30 DIAGNOSIS — O283 Abnormal ultrasonic finding on antenatal screening of mother: Secondary | ICD-10-CM | POA: Insufficient documentation

## 2021-01-30 DIAGNOSIS — Z98891 History of uterine scar from previous surgery: Secondary | ICD-10-CM

## 2021-01-30 DIAGNOSIS — Z3480 Encounter for supervision of other normal pregnancy, unspecified trimester: Secondary | ICD-10-CM

## 2021-01-30 DIAGNOSIS — Z789 Other specified health status: Secondary | ICD-10-CM

## 2021-01-30 NOTE — Progress Notes (Signed)
   PRENATAL VISIT NOTE  Subjective:  Makayla Mcfarland is a 35 y.o. [phone] at [redacted]w[redacted]d being seen today for ongoing prenatal care.  She is currently monitored for the following issues for this low-risk pregnancy and has Supervision of other normal pregnancy, antepartum; Language barrier; History of cesarean section; and Fetal echogenic intracardiac focus on prenatal ultrasound on their problem list.  Patient reports no complaints.  Contractions: Not present. Vag. Bleeding: None.  Movement: Present. Denies leaking of fluid.   The following portions of the patient's history were reviewed and updated as appropriate: allergies, current medications, past family history, past medical history, past social history, past surgical history and problem list.   Objective:   Vitals:   01/30/21 1336  BP: 102/69  Pulse: 90  Weight: 135 lb 9.6 oz (61.5 kg)    Fetal Status: Fetal Heart Rate (bpm): 152   Movement: Present     General:  Alert, oriented and cooperative. Patient is in no acute distress.  Skin: Skin is warm and dry. No rash noted.   Cardiovascular: Normal heart rate noted  Respiratory: Normal respiratory effort, no problems with respiration noted  Abdomen: Soft, gravid, appropriate for gestational age.  Pain/Pressure: Absent     Pelvic: Cervical exam deferred        Extremities: Normal range of motion.  Edema: None  Mental Status: Normal mood and affect. Normal behavior. Normal judgment and thought content.   Assessment and Plan:  Pregnancy: G3P1102 at [redacted]w[redacted]d 1. Supervision of other normal pregnancy, antepartum - Doing well - Anticipatory guidance for next visit discussed including 2 hour GTT, CBC, HIV, RPR and TDAP  2. History of cesarean section - History of VBAC previously - Will sign TOLAC consent at next visit, discussed with patient today   3. Language barrier - Interpreter present   4. [redacted] weeks gestation of pregnancy  5. Fetal echogenic intracardiac focus on prenatal  ultrasound - Repeat US 02/06/21 - Patient reassured of normal NIPS and AFP  Preterm labor symptoms and general obstetric precautions including but not limited to vaginal bleeding, contractions, leaking of fluid and fetal movement were reviewed in detail with the patient. Please refer to After Visit Summary for other counseling recommendations.   Return in about 4 weeks (around 02/27/2021) for LOB, 28 week labs (fasting), In-Person.  Future Appointments  Date Time Provider Department Center  02/06/2021  3:30 PM [facility] NURSE [facility]  02/06/2021  3:45 PM [facility] US5 [facility]    Vonzella Nipple, PA-C

## 2021-01-30 NOTE — Progress Notes (Signed)
No questions or concerns

## 2021-02-06 ENCOUNTER — Ambulatory Visit: Payer: Medicaid Other | Admitting: *Deleted

## 2021-02-06 ENCOUNTER — Other Ambulatory Visit: Payer: Self-pay

## 2021-02-06 ENCOUNTER — Encounter: Payer: Self-pay | Admitting: *Deleted

## 2021-02-06 ENCOUNTER — Ambulatory Visit: Payer: Medicaid Other | Attending: Obstetrics

## 2021-02-06 VITALS — BP 99/67 | HR 86

## 2021-02-06 DIAGNOSIS — O34219 Maternal care for unspecified type scar from previous cesarean delivery: Secondary | ICD-10-CM | POA: Diagnosis not present

## 2021-02-06 DIAGNOSIS — O09212 Supervision of pregnancy with history of pre-term labor, second trimester: Secondary | ICD-10-CM | POA: Diagnosis not present

## 2021-02-06 DIAGNOSIS — Z98891 History of uterine scar from previous surgery: Secondary | ICD-10-CM | POA: Insufficient documentation

## 2021-02-06 DIAGNOSIS — Z3A24 24 weeks gestation of pregnancy: Secondary | ICD-10-CM

## 2021-02-06 DIAGNOSIS — O283 Abnormal ultrasonic finding on antenatal screening of mother: Secondary | ICD-10-CM

## 2021-02-06 DIAGNOSIS — Z3480 Encounter for supervision of other normal pregnancy, unspecified trimester: Secondary | ICD-10-CM | POA: Insufficient documentation

## 2021-02-27 ENCOUNTER — Other Ambulatory Visit: Payer: Medicaid Other

## 2021-02-27 ENCOUNTER — Other Ambulatory Visit: Payer: Self-pay

## 2021-02-27 ENCOUNTER — Ambulatory Visit (INDEPENDENT_AMBULATORY_CARE_PROVIDER_SITE_OTHER): Payer: Medicaid Other | Admitting: Student

## 2021-02-27 ENCOUNTER — Other Ambulatory Visit: Payer: Self-pay | Admitting: General Practice

## 2021-02-27 VITALS — BP 98/68 | HR 89 | Wt 141.9 lb

## 2021-02-27 DIAGNOSIS — Z3480 Encounter for supervision of other normal pregnancy, unspecified trimester: Secondary | ICD-10-CM

## 2021-02-27 DIAGNOSIS — Z23 Encounter for immunization: Secondary | ICD-10-CM | POA: Diagnosis not present

## 2021-02-27 DIAGNOSIS — Z3A27 27 weeks gestation of pregnancy: Secondary | ICD-10-CM

## 2021-02-27 NOTE — Progress Notes (Addendum)
   PRENATAL VISIT NOTE  Subjective:  Makayla Mcfarland is a 35 y.o. [phone] at [redacted]w[redacted]d being seen today for ongoing prenatal care.  She is currently monitored for the following issues for this low-risk pregnancy and has Supervision of other normal pregnancy, antepartum; Language barrier; History of cesarean section; and Fetal echogenic intracardiac focus on prenatal ultrasound on their problem list.  Patient reports no complaints. She has questions about birth control, 2 hour results, location of hospital and VBAC.  Contractions: Not present. Vag. Bleeding: None.  Movement: Present. Denies leaking of fluid.   The following portions of the patient's history were reviewed and updated as appropriate: allergies, current medications, past family history, past medical history, past social history, past surgical history and problem list.   Objective:   Vitals:   02/27/21 0956  BP: 98/68  Pulse: 89  Weight: 141 lb 14.4 oz (64.4 kg)    Fetal Status: Fetal Heart Rate (bpm): 152 Fundal Height: 26 cm Movement: Present     General:  Alert, oriented and cooperative. Patient is in no acute distress.  Skin: Skin is warm and dry. No rash noted.   Cardiovascular: Normal heart rate noted  Respiratory: Normal respiratory effort, no problems with respiration noted  Abdomen: Soft, gravid, appropriate for gestational age.  Pain/Pressure: Absent     Pelvic: Cervical exam deferred        Extremities: Normal range of motion.  Edema: None  Mental Status: Normal mood and affect. Normal behavior. Normal judgment and thought content.   Assessment and Plan:  Pregnancy: G3P1102 at [redacted]w[redacted]d 1. [redacted] weeks gestation of pregnancy -2 hour GTT in process  -signed VBAC today; patient signed today; she is an excellent candidate as she has had a successful   -she is interested in depo; she is also interested in BTL. Discussed importance of signing 4 weeks prior-she will talk to husband and discuss Directions given to [facility] Reviewed  next steps if patient has GDM - Tdap vaccine greater than or equal to 7yo IM   Live Interpreter Anette Riedel used during this encounter, including signing of VBAC form.  Preterm labor symptoms and general obstetric precautions including but not limited to vaginal bleeding, contractions, leaking of fluid and fetal movement were reviewed in detail with the patient. Please refer to After Visit Summary for other counseling recommendations.   Return in about 2 weeks (around 03/13/2021), or LROB.  Future Appointments  Date Time Provider Department Center  03/13/2021 10:15 AM Currie Paris, NP [facility]    Charlesetta Garibaldi [place], [place]

## 2021-02-27 NOTE — Patient Instructions (Signed)
Location of [facility] and women's care services located on the [place] side of [facility] [place]. [facility] (Entrance [address] off [address] and the intersection of [place] and [employer]. Valet service is available 24/7/365.   Women's and [employer]

## 2021-02-28 LAB — CBC
Hematocrit: 35.4 % (ref 34.0–46.6)
Hemoglobin: 11.8 g/dL (ref 11.1–15.9)
MCH: 31.6 pg (ref 26.6–33.0)
MCHC: 33.3 g/dL (ref 31.5–35.7)
MCV: 95 fL (ref 79–97)
Platelets: 247 10*3/uL (ref 150–450)
RBC: 3.74 x10E6/uL — ABNORMAL LOW (ref 3.77–5.28)
RDW: 12.2 % (ref 11.7–15.4)
WBC: 9.6 10*3/uL (ref 3.4–10.8)

## 2021-02-28 LAB — GLUCOSE TOLERANCE, 2 HOURS W/ 1HR
Glucose, 1 hour: 168 mg/dL (ref 65–179)
Glucose, 2 hour: 141 mg/dL (ref 65–152)
Glucose, Fasting: 69 mg/dL (ref 65–91)

## 2021-02-28 LAB — HIV ANTIBODY (ROUTINE TESTING W REFLEX): HIV Screen 4th Generation wRfx: NONREACTIVE

## 2021-02-28 LAB — RPR: RPR Ser Ql: NONREACTIVE

## 2021-03-13 ENCOUNTER — Other Ambulatory Visit: Payer: Self-pay

## 2021-03-13 ENCOUNTER — Ambulatory Visit (INDEPENDENT_AMBULATORY_CARE_PROVIDER_SITE_OTHER): Payer: Medicaid Other | Admitting: Nurse Practitioner

## 2021-03-13 VITALS — BP 101/68 | HR 93 | Wt 146.1 lb

## 2021-03-13 DIAGNOSIS — Z789 Other specified health status: Secondary | ICD-10-CM

## 2021-03-13 DIAGNOSIS — Z3480 Encounter for supervision of other normal pregnancy, unspecified trimester: Secondary | ICD-10-CM

## 2021-03-13 DIAGNOSIS — Z98891 History of uterine scar from previous surgery: Secondary | ICD-10-CM

## 2021-03-13 DIAGNOSIS — Z3A29 29 weeks gestation of pregnancy: Secondary | ICD-10-CM

## 2021-03-13 NOTE — Progress Notes (Signed)
    Subjective:  Makayla Mcfarland is a 35 y.o. G3P1102 at [redacted]w[redacted]d being seen today for ongoing prenatal care.  She is currently monitored for the following issues for this low-risk pregnancy and has Supervision of other normal pregnancy, antepartum; Language barrier; History of cesarean section; and Fetal echogenic intracardiac focus on prenatal ultrasound on their problem list.  Patient reports no complaints.  Contractions: Not present. Vag. Bleeding: None.  Movement: Present. Denies leaking of fluid.   The following portions of the patient's history were reviewed and updated as appropriate: allergies, current medications, past family history, past medical history, past social history, past surgical history and problem list. Problem list updated.  Objective:   Vitals:   03/13/21 1025  BP: 101/68  Pulse: 93  Weight: 146 lb 1.6 oz (66.3 kg)    Fetal Status: Fetal Heart Rate (bpm): 153 Fundal Height: 29 cm Movement: Present     General:  Alert, oriented and cooperative. Patient is in no acute distress.  Skin: Skin is warm and dry. No rash noted.   Cardiovascular: Normal heart rate noted  Respiratory: Normal respiratory effort, no problems with respiration noted  Abdomen: Soft, gravid, appropriate for gestational age. Pain/Pressure: Absent     Pelvic:  Cervical exam deferred        Extremities: Normal range of motion.  Edema: None  Mental Status: Normal mood and affect. Normal behavior. Normal judgment and thought content.   Urinalysis:      Assessment and Plan:  Pregnancy: G3P1102 at [redacted]w[redacted]d  1. Supervision of other normal pregnancy, antepartum Doing well.  Baby is moving well Still considering BTL - discussed signing consent at next visit if considering at all.  Signing consent does not mean BTL will be done.  Signing consent is just for insurance coverage if she decides to have BTL Given info on IUD also  - reversible contraception vs BTL as permanent contraception  2. History of  cesarean section Planning TOLAC - has signed consent  3. Language barrier Has in person interpreter Understands much of the [place] conversation but interpreter helps her clarify lots of points.  4. [redacted] weeks gestation of pregnancy   Preterm labor symptoms and general obstetric precautions including but not limited to vaginal bleeding, contractions, leaking of fluid and fetal movement were reviewed in detail with the patient. Please refer to After Visit Summary for other counseling recommendations.  Return in about 2 weeks (around 03/27/2021), or in person ROB.  Nolene Bernheim, RN, MSN, NP-BC Nurse Practitioner, [facility] for [employer], [facility] 03/13/2021 12:37 PM

## 2021-04-03 ENCOUNTER — Other Ambulatory Visit: Payer: Self-pay

## 2021-04-03 ENCOUNTER — Ambulatory Visit (INDEPENDENT_AMBULATORY_CARE_PROVIDER_SITE_OTHER): Payer: Medicaid Other | Admitting: Nurse Practitioner

## 2021-04-03 VITALS — BP 102/62 | HR 91 | Wt 151.9 lb

## 2021-04-03 DIAGNOSIS — Z98891 History of uterine scar from previous surgery: Secondary | ICD-10-CM

## 2021-04-03 DIAGNOSIS — Z3480 Encounter for supervision of other normal pregnancy, unspecified trimester: Secondary | ICD-10-CM

## 2021-04-03 NOTE — Progress Notes (Signed)
    Subjective:  Makayla Mcfarland is a 35 y.o. G3P1102 at [redacted]w[redacted]d being seen today for ongoing prenatal care.  She is currently monitored for the following issues for this low-risk pregnancy and has Supervision of other normal pregnancy, antepartum; Language barrier; History of cesarean section; and Fetal echogenic intracardiac focus on prenatal ultrasound on their problem list.  Patient reports no complaints.  Contractions: Not present. Vag. Bleeding: None.  Movement: Present. Denies leaking of fluid.   The following portions of the patient's history were reviewed and updated as appropriate: allergies, current medications, past family history, past medical history, past social history, past surgical history and problem list. Problem list updated.  Objective:   Vitals:   04/03/21 1424  BP: 102/62  Pulse: 91  Weight: 151 lb 14.4 oz (68.9 kg)    Fetal Status: Fetal Heart Rate (bpm): 137 Fundal Height: 33 cm Movement: Present     General:  Alert, oriented and cooperative. Patient is in no acute distress.  Skin: Skin is warm and dry. No rash noted.   Cardiovascular: Normal heart rate noted  Respiratory: Normal respiratory effort, no problems with respiration noted  Abdomen: Soft, gravid, appropriate for gestational age. Pain/Pressure: Absent     Pelvic:  Cervical exam deferred        Extremities: Normal range of motion.  Edema: None  Mental Status: Normal mood and affect. Normal behavior. Normal judgment and thought content.   Urinalysis:      Assessment and Plan:  Pregnancy: G3P1102 at [redacted]w[redacted]d  1. Supervision of other normal pregnancy, antepartum Has decided not to have BTL - will use condoms  2. History of cesarean section Plans TOLAC  Preterm labor symptoms and general obstetric precautions including but not limited to vaginal bleeding, contractions, leaking of fluid and fetal movement were reviewed in detail with the patient. Please refer to After Visit Summary for other counseling  recommendations.  Return in about 2 weeks (around 04/17/2021) for in person ROB.  Nolene Bernheim, RN, MSN, NP-BC Nurse Practitioner, [facility] for [employer], [facility] 04/03/2021 2:46 PM

## 2021-04-24 ENCOUNTER — Encounter: Payer: Self-pay | Admitting: Medical

## 2021-04-24 ENCOUNTER — Ambulatory Visit (INDEPENDENT_AMBULATORY_CARE_PROVIDER_SITE_OTHER): Payer: Medicaid Other | Admitting: Medical

## 2021-04-24 ENCOUNTER — Other Ambulatory Visit: Payer: Self-pay

## 2021-04-24 VITALS — BP 106/70 | HR 96 | Wt 156.1 lb

## 2021-04-24 DIAGNOSIS — Z23 Encounter for immunization: Secondary | ICD-10-CM

## 2021-04-24 DIAGNOSIS — Z3A35 35 weeks gestation of pregnancy: Secondary | ICD-10-CM

## 2021-04-24 DIAGNOSIS — O283 Abnormal ultrasonic finding on antenatal screening of mother: Secondary | ICD-10-CM

## 2021-04-24 DIAGNOSIS — Z98891 History of uterine scar from previous surgery: Secondary | ICD-10-CM

## 2021-04-24 DIAGNOSIS — Z3480 Encounter for supervision of other normal pregnancy, unspecified trimester: Secondary | ICD-10-CM

## 2021-04-24 DIAGNOSIS — Z789 Other specified health status: Secondary | ICD-10-CM

## 2021-04-24 NOTE — Progress Notes (Signed)
   PRENATAL VISIT NOTE  Subjective:  Makayla Mcfarland is a 35 y.o. G3P1102 at [redacted]w[redacted]d being seen today for ongoing prenatal care.  She is currently monitored for the following issues for this low-risk pregnancy and has Supervision of other normal pregnancy, antepartum; Language barrier; History of cesarean section; and Fetal echogenic intracardiac focus on prenatal ultrasound on their problem list.  Patient reports no complaints.  Contractions: Irritability. Vag. Bleeding: None.  Movement: Present. Denies leaking of fluid.   The following portions of the patient's history were reviewed and updated as appropriate: allergies, current medications, past family history, past medical history, past social history, past surgical history and problem list.   Objective:   Vitals:   04/24/21 1552  BP: 106/70  Pulse: 96  Weight: 156 lb 1.6 oz (70.8 kg)    Fetal Status: Fetal Heart Rate (bpm): 152 Fundal Height: 34 cm Movement: Present     General:  Alert, oriented and cooperative. Patient is in no acute distress.  Skin: Skin is warm and dry. No rash noted.   Cardiovascular: Normal heart rate noted  Respiratory: Normal respiratory effort, no problems with respiration noted  Abdomen: Soft, gravid, appropriate for gestational age.  Pain/Pressure: Absent     Pelvic: Cervical exam deferred        Extremities: Normal range of motion.  Edema: None  Mental Status: Normal mood and affect. Normal behavior. Normal judgment and thought content.   Assessment and Plan:  Pregnancy: G3P1102 at [redacted]w[redacted]d 1. Supervision of other normal pregnancy, antepartum - Planning condoms for MOC  2. Fetal echogenic intracardiac focus on prenatal ultrasound - No other concerns noted on [place] - NIPS Low Risk, Neg AFP  3. Language barrier - Interpreter present, patient has good understanding   4. History of cesarean section - TOLAC signed previously   5. [redacted] weeks gestation of pregnancy  Preterm labor symptoms and general  obstetric precautions including but not limited to vaginal bleeding, contractions, leaking of fluid and fetal movement were reviewed in detail with the patient. Please refer to After Visit Summary for other counseling recommendations.   Return in about 2 weeks (around 05/08/2021) for LOB, In-Person.  No future appointments.  Vonzella Nipple, PA-C

## 2021-05-08 ENCOUNTER — Other Ambulatory Visit: Payer: Self-pay

## 2021-05-08 ENCOUNTER — Ambulatory Visit (INDEPENDENT_AMBULATORY_CARE_PROVIDER_SITE_OTHER): Payer: Medicaid Other | Admitting: Obstetrics and Gynecology

## 2021-05-08 ENCOUNTER — Other Ambulatory Visit (HOSPITAL_COMMUNITY)
Admission: RE | Admit: 2021-05-08 | Discharge: 2021-05-08 | Disposition: A | Discharge status: Home / Self Care | Payer: Medicaid Other | Source: Ambulatory Visit | Attending: Obstetrics and Gynecology | Admitting: Obstetrics and Gynecology

## 2021-05-08 VITALS — BP 113/77 | HR 100 | Wt 159.5 lb

## 2021-05-08 DIAGNOSIS — Z3480 Encounter for supervision of other normal pregnancy, unspecified trimester: Secondary | ICD-10-CM | POA: Diagnosis not present

## 2021-05-08 DIAGNOSIS — Z3A37 37 weeks gestation of pregnancy: Secondary | ICD-10-CM | POA: Insufficient documentation

## 2021-05-08 DIAGNOSIS — Z98891 History of uterine scar from previous surgery: Secondary | ICD-10-CM

## 2021-05-08 DIAGNOSIS — O34219 Maternal care for unspecified type scar from previous cesarean delivery: Secondary | ICD-10-CM | POA: Insufficient documentation

## 2021-05-08 DIAGNOSIS — O283 Abnormal ultrasonic finding on antenatal screening of mother: Secondary | ICD-10-CM

## 2021-05-08 DIAGNOSIS — Z789 Other specified health status: Secondary | ICD-10-CM

## 2021-05-08 NOTE — Progress Notes (Signed)
   PRENATAL VISIT NOTE  Subjective:  Makayla Mcfarland is a 34 y.o. G3P1102 at [redacted]w[redacted]d being seen today for ongoing prenatal care.  She is currently monitored for the following issues for this high-risk pregnancy and has Supervision of other normal pregnancy, antepartum; Language barrier; History of cesarean section; Fetal echogenic intracardiac focus on prenatal ultrasound; [redacted] weeks gestation of pregnancy; and Hx successful VBAC (vaginal birth after cesarean), currently pregnant on their problem list.  Patient doing well with no acute concerns today. She reports no complaints.  Contractions: Irritability. Vag. Bleeding: None.  Movement: Present. Denies leaking of fluid.   The following portions of the patient's history were reviewed and updated as appropriate: allergies, current medications, past family history, past medical history, past social history, past surgical history and problem list. Problem list updated.  Objective:   Vitals:   05/08/21 1447  BP: 113/77  Pulse: 100  Weight: 159 lb 8 oz (72.3 kg)    Fetal Status: Fetal Heart Rate (bpm): 143 Fundal Height: 37 cm Movement: Present  Presentation: Vertex  General:  Alert, oriented and cooperative. Patient is in no acute distress.  Skin: Skin is warm and dry. No rash noted.   Cardiovascular: Normal heart rate noted  Respiratory: Normal respiratory effort, no problems with respiration noted  Abdomen: Soft, gravid, appropriate for gestational age.  Pain/Pressure: Present     Pelvic: Cervical exam performed Dilation: 1.5 Effacement (%): 70 Station: -2  Extremities: Normal range of motion.  Edema: None  Mental Status:  Normal mood and affect. Normal behavior. Normal judgment and thought content.   Assessment and Plan:  Pregnancy: G3P1102 at [redacted]w[redacted]d  1. [redacted] weeks gestation of pregnancy   2. Supervision of other normal pregnancy, antepartum Continue routine care  - GC/Chlamydia probe amp ([place])not at [facility] - Culture, beta strep  (group b only)  3. Language barrier Live interpreter present  4. History of cesarean section   5. Fetal echogenic intracardiac focus on prenatal ultrasound   6. Hx successful VBAC (vaginal birth after cesarean), currently pregnant Pt desires TOLAC, consent previously signed  Term labor symptoms and general obstetric precautions including but not limited to vaginal bleeding, contractions, leaking of fluid and fetal movement were reviewed in detail with the patient.  Please refer to After Visit Summary for other counseling recommendations.   Return in about 1 week (around 05/15/2021) for ROB, in person.   Mariel Aloe, MD Faculty Attending Center for [facility]

## 2021-05-09 LAB — GC/CHLAMYDIA PROBE AMP (~~LOC~~) NOT AT ARMC
Chlamydia: NEGATIVE
Comment: NEGATIVE
Comment: NORMAL
Neisseria Gonorrhea: NEGATIVE

## 2021-05-12 LAB — CULTURE, BETA STREP (GROUP B ONLY): Strep Gp B Culture: NEGATIVE

## 2021-05-15 ENCOUNTER — Other Ambulatory Visit: Payer: Self-pay

## 2021-05-15 ENCOUNTER — Ambulatory Visit (INDEPENDENT_AMBULATORY_CARE_PROVIDER_SITE_OTHER): Payer: Medicaid Other | Admitting: Family Medicine

## 2021-05-15 VITALS — BP 107/71 | HR 97 | Wt 160.3 lb

## 2021-05-15 DIAGNOSIS — O283 Abnormal ultrasonic finding on antenatal screening of mother: Secondary | ICD-10-CM

## 2021-05-15 DIAGNOSIS — Z98891 History of uterine scar from previous surgery: Secondary | ICD-10-CM

## 2021-05-15 DIAGNOSIS — Z3480 Encounter for supervision of other normal pregnancy, unspecified trimester: Secondary | ICD-10-CM

## 2021-05-15 NOTE — Progress Notes (Signed)
   Subjective:  Makayla Mcfarland is a 35 y.o. G3P1102 at [redacted]w[redacted]d being seen today for ongoing prenatal care.  She is currently monitored for the following issues for this low-risk pregnancy and has Supervision of other normal pregnancy, antepartum; Language barrier; History of cesarean section; Fetal echogenic intracardiac focus on prenatal ultrasound; [redacted] weeks gestation of pregnancy; and Hx successful VBAC (vaginal birth after cesarean), currently pregnant on their problem list.  Patient reports  intermittent contractions .  Contractions: Irritability. Vag. Bleeding: None.  Movement: Present. Denies leaking of fluid.   The following portions of the patient's history were reviewed and updated as appropriate: allergies, current medications, past family history, past medical history, past social history, past surgical history and problem list. Problem list updated.  Objective:   Vitals:   05/15/21 1515  BP: 107/71  Pulse: 97  Weight: 160 lb 4.8 oz (72.7 kg)    Fetal Status: Fetal Heart Rate (bpm): 145   Movement: Present     General:  Alert, oriented and cooperative. Patient is in no acute distress.  Skin: Skin is warm and dry. No rash noted.   Cardiovascular: Normal heart rate noted  Respiratory: Normal respiratory effort, no problems with respiration noted  Abdomen: Soft, gravid, appropriate for gestational age. Pain/Pressure: Absent     Pelvic: Vag. Bleeding: None     Cervical exam performed      1.5 cm/60/-2  Extremities: Normal range of motion.  Edema: None  Mental Status: Normal mood and affect. Normal behavior. Normal judgment and thought content.    Assessment and Plan:  Pregnancy: G3P1102 at [redacted]w[redacted]d  1. Supervision of other normal pregnancy, antepartum Doing well. Having some sporadic contractions (two to three times a day). Denies any vaginal bleeding or leaking of fluid. No other concerns at this time. Cervix checked per patient preference, unchanged from last visit - follow up  in 1 week  2. Language barrier In person Mandarin interpreter present  3. History of cesarean section Desires TOLAC. Consent signed on 02/27/2021  Term labor symptoms and general obstetric precautions including but not limited to vaginal bleeding, contractions, leaking of fluid and fetal movement were reviewed in detail with the patient. Please refer to After Visit Summary for other counseling recommendations.  Return in about 1 week (around 05/22/2021) for LROB, any provider. Warner Mccreedy, MD, MPH OB Fellow, Faculty Practice

## 2021-05-16 ENCOUNTER — Encounter (HOSPITAL_COMMUNITY): Payer: Self-pay | Admitting: Obstetrics & Gynecology

## 2021-05-16 ENCOUNTER — Inpatient Hospital Stay (HOSPITAL_COMMUNITY)
Admission: AD | Admit: 2021-05-16 | Discharge: 2021-05-17 | DRG: 807 | Disposition: A | Discharge status: Home / Self Care | Payer: Medicaid Other | Attending: Family Medicine | Admitting: Family Medicine

## 2021-05-16 DIAGNOSIS — O26893 Other specified pregnancy related conditions, third trimester: Secondary | ICD-10-CM | POA: Diagnosis not present

## 2021-05-16 DIAGNOSIS — O34211 Maternal care for low transverse scar from previous cesarean delivery: Secondary | ICD-10-CM | POA: Diagnosis not present

## 2021-05-16 DIAGNOSIS — Z3A38 38 weeks gestation of pregnancy: Secondary | ICD-10-CM | POA: Diagnosis not present

## 2021-05-16 DIAGNOSIS — Z789 Other specified health status: Secondary | ICD-10-CM | POA: Diagnosis present

## 2021-05-16 DIAGNOSIS — Z98891 History of uterine scar from previous surgery: Secondary | ICD-10-CM

## 2021-05-16 DIAGNOSIS — O34219 Maternal care for unspecified type scar from previous cesarean delivery: Secondary | ICD-10-CM | POA: Diagnosis present

## 2021-05-16 DIAGNOSIS — Z20822 Contact with and (suspected) exposure to covid-19: Secondary | ICD-10-CM | POA: Diagnosis present

## 2021-05-16 DIAGNOSIS — Z3480 Encounter for supervision of other normal pregnancy, unspecified trimester: Secondary | ICD-10-CM

## 2021-05-16 DIAGNOSIS — O283 Abnormal ultrasonic finding on antenatal screening of mother: Secondary | ICD-10-CM

## 2021-05-16 DIAGNOSIS — Z603 Acculturation difficulty: Secondary | ICD-10-CM | POA: Diagnosis present

## 2021-05-16 LAB — CBC
HCT: 37.6 % (ref 36.0–46.0)
Hemoglobin: 12.4 g/dL (ref 12.0–15.0)
MCH: 30.2 pg (ref 26.0–34.0)
MCHC: 33 g/dL (ref 30.0–36.0)
MCV: 91.7 fL (ref 80.0–100.0)
Platelets: 257 10*3/uL (ref 150–400)
RBC: 4.1 MIL/uL (ref 3.87–5.11)
RDW: 13.9 % (ref 11.5–15.5)
WBC: 12 10*3/uL — ABNORMAL HIGH (ref 4.0–10.5)
nRBC: 0 % (ref 0.0–0.2)

## 2021-05-16 LAB — RESP PANEL BY RT-PCR (FLU A&B, COVID) ARPGX2
Influenza A by PCR: NEGATIVE
Influenza B by PCR: NEGATIVE
SARS Coronavirus 2 by RT PCR: NEGATIVE

## 2021-05-16 LAB — TYPE AND SCREEN
ABO/RH(D): AB POS
Antibody Screen: NEGATIVE

## 2021-05-16 MED ORDER — ONDANSETRON HCL 4 MG/2ML IJ SOLN: 4.0000 mg | INTRAMUSCULAR | Status: DC | PRN

## 2021-05-16 MED ORDER — ACETAMINOPHEN 325 MG PO TABS: 650.0000 mg | ORAL_TABLET | ORAL | Status: DC | PRN

## 2021-05-16 MED ORDER — DIPHENHYDRAMINE HCL 25 MG PO CAPS: 25.0000 mg | ORAL_CAPSULE | Freq: Four times a day (QID) | ORAL | Status: DC | PRN

## 2021-05-16 MED ORDER — WITCH HAZEL-GLYCERIN EX PADS: 1.0000 "application " | MEDICATED_PAD | CUTANEOUS | Status: DC | PRN

## 2021-05-16 MED ORDER — OXYCODONE HCL 5 MG PO TABS: 5.0000 mg | ORAL_TABLET | ORAL | Status: DC | PRN

## 2021-05-16 MED ORDER — LACTATED RINGERS IV SOLN: 500.0000 mL | INTRAVENOUS | Status: DC | PRN

## 2021-05-16 MED ORDER — LIDOCAINE HCL (PF) 1 % IJ SOLN
30.0000 mL | INTRAMUSCULAR | Status: AC | PRN
  Administered 2021-05-16: 30 mL via SUBCUTANEOUS
  Filled 2021-05-16: qty 30

## 2021-05-16 MED ORDER — FENTANYL CITRATE (PF) 100 MCG/2ML IJ SOLN
100.0000 ug | INTRAMUSCULAR | Status: DC | PRN
  Administered 2021-05-16: 100 ug via INTRAVENOUS
  Filled 2021-05-16: qty 2

## 2021-05-16 MED ORDER — SOD CITRATE-CITRIC ACID 500-334 MG/5ML PO SOLN
30.0000 mL | ORAL | Status: DC | PRN
Start: 2021-05-16 — End: 2021-05-16

## 2021-05-16 MED ORDER — OXYTOCIN-SODIUM CHLORIDE 30-0.9 UT/500ML-% IV SOLN
2.5000 [IU]/h | INTRAVENOUS | Status: DC
  Administered 2021-05-16: 2.5 [IU]/h via INTRAVENOUS
  Filled 2021-05-16 (×2): qty 500

## 2021-05-16 MED ORDER — COCONUT OIL OIL: 1.0000 "application " | TOPICAL_OIL | Status: DC | PRN

## 2021-05-16 MED ORDER — BENZOCAINE-MENTHOL 20-0.5 % EX AERO
1.0000 "application " | INHALATION_SPRAY | CUTANEOUS | Status: DC | PRN
  Administered 2021-05-16: 1 via TOPICAL
  Filled 2021-05-16: qty 56

## 2021-05-16 MED ORDER — LACTATED RINGERS IV SOLN: INTRAVENOUS | Status: DC

## 2021-05-16 MED ORDER — OXYCODONE-ACETAMINOPHEN 5-325 MG PO TABS
2.0000 | ORAL_TABLET | ORAL | Status: DC | PRN
Start: 2021-05-16 — End: 2021-05-16

## 2021-05-16 MED ORDER — ACETAMINOPHEN 325 MG PO TABS
650.0000 mg | ORAL_TABLET | ORAL | Status: DC | PRN
Start: 2021-05-16 — End: 2021-05-17

## 2021-05-16 MED ORDER — ZOLPIDEM TARTRATE 5 MG PO TABS: 5.0000 mg | ORAL_TABLET | Freq: Every evening | ORAL | Status: DC | PRN

## 2021-05-16 MED ORDER — SIMETHICONE 80 MG PO CHEW
80.0000 mg | CHEWABLE_TABLET | ORAL | Status: DC | PRN
Start: 2021-05-16 — End: 2021-05-17

## 2021-05-16 MED ORDER — TETANUS-DIPHTH-ACELL PERTUSSIS 5-2.5-18.5 LF-MCG/0.5 IM SUSY: 0.5000 mL | PREFILLED_SYRINGE | Freq: Once | INTRAMUSCULAR | Status: DC

## 2021-05-16 MED ORDER — ONDANSETRON HCL 4 MG/2ML IJ SOLN
4.0000 mg | Freq: Four times a day (QID) | INTRAMUSCULAR | Status: DC | PRN
Start: 2021-05-16 — End: 2021-05-16

## 2021-05-16 MED ORDER — DIBUCAINE (PERIANAL) 1 % EX OINT: 1.0000 "application " | TOPICAL_OINTMENT | CUTANEOUS | Status: DC | PRN

## 2021-05-16 MED ORDER — SENNOSIDES-DOCUSATE SODIUM 8.6-50 MG PO TABS
2.0000 | ORAL_TABLET | ORAL | Status: DC
  Administered 2021-05-17: 2 via ORAL
  Filled 2021-05-16: qty 2

## 2021-05-16 MED ORDER — OXYCODONE-ACETAMINOPHEN 5-325 MG PO TABS: 1.0000 | ORAL_TABLET | ORAL | Status: DC | PRN

## 2021-05-16 MED ORDER — ONDANSETRON HCL 4 MG PO TABS: 4.0000 mg | ORAL_TABLET | ORAL | Status: DC | PRN

## 2021-05-16 MED ORDER — MEASLES, MUMPS & RUBELLA VAC IJ SOLR: 0.5000 mL | Freq: Once | INTRAMUSCULAR | Status: DC

## 2021-05-16 MED ORDER — OXYTOCIN BOLUS FROM INFUSION
333.0000 mL | Freq: Once | INTRAVENOUS | Status: AC
  Administered 2021-05-16: 333 mL via INTRAVENOUS

## 2021-05-16 MED ORDER — PRENATAL MULTIVITAMIN CH
1.0000 | ORAL_TABLET | Freq: Every day | ORAL | Status: DC
  Administered 2021-05-17: 1 via ORAL
  Filled 2021-05-16: qty 1

## 2021-05-16 MED ORDER — IBUPROFEN 600 MG PO TABS
600.0000 mg | ORAL_TABLET | Freq: Four times a day (QID) | ORAL | Status: DC
  Administered 2021-05-16 – 2021-05-17 (×4): 600 mg via ORAL
  Filled 2021-05-16 (×3): qty 1

## 2021-05-16 NOTE — Progress Notes (Signed)
Contacted by nursing staff. Question about uterine scar. Patient chart documented a "classical" c/section, however, we have been unable to get records to see what type of uterine scar the patient has. The patient has had a successful VBAC and was never told she could not labor. She has a pfannensteil incision on her abdomen. In this case, I would treat the patient as if she has a uterine scar of an unknown type and will proceed with TOLAC.  Levie Heritage, DO

## 2021-05-16 NOTE — Discharge Summary (Signed)
Postpartum Discharge Summary  Date of Service updated     Patient Name: Makayla Mcfarland DOB: 1986-04-20 MRN: [id number]  Date of admission: 05/16/2021 Delivery date:05/16/2021  Delivering provider: Serita Grammes D  Date of discharge: 05/17/2021  Admitting diagnosis: Uterine contractions [O47.9] Intrauterine pregnancy: [redacted]w[redacted]d    Secondary diagnosis:  Active Problems:   Language barrier   History of cesarean section   Hx successful VBAC (vaginal birth after cesarean), currently pregnant  Additional problems: none    Discharge diagnosis: Term Pregnancy Delivered and VBAC                                              Post partum procedures: none Augmentation:  none Complications: None  Hospital course: Onset of Labor With Vaginal Delivery      35y.o. yo [id number] admitted in Active Labor on 05/16/2021. She progressed spontaneously with an uncomplicated labor course as follows:  Membrane Rupture Time/Date: 1:45 PM ,05/16/2021   Delivery Method:VBAC, Spontaneous  Episiotomy: None  Lacerations:  2nd degree  Patient had an uncomplicated postpartum course.  She is ambulating, tolerating a regular diet, passing flatus, and urinating well. Patient is discharged home in stable condition on 05/17/21.  Newborn Data: Birth date:05/16/2021  Birth time:2:01 PM  Gender:Female  Living status:Living  Apgars:8 ,9  Weight:3535 g (7lb 12.7oz)  Magnesium Sulfate received: No BMZ received: No Rhophylac:N/A MMR:N/A T-DaP:Given prenatally Flu: Yes Transfusion:No  Physical exam  Vitals:   05/16/21 1735 05/16/21 2125 05/17/21 0118 05/17/21 0525  BP: 93/63 101/68 106/68 106/82  Pulse: 72 84 76 76  Resp: 16 16 18 18   Temp: 98 F (36.7 C) 98.8 F (37.1 C) 98.3 F (36.8 C) 98.2 F (36.8 C)  TempSrc: Oral Oral Oral Oral  SpO2: 98% 98% 99% 99%  Weight:      Height:       General: alert, cooperative, and no distress Lochia: appropriate Uterine Fundus: firm Incision:  N/A DVT Evaluation: minimal edema to mid shin bilaterally  Labs: Lab Results  Component Value Date   WBC 12.0 (H) 05/16/2021   HGB 12.4 05/16/2021   HCT 37.6 05/16/2021   MCV 91.7 05/16/2021   PLT 257 05/16/2021   No flowsheet data found. Edinburgh Score: Edinburgh Postnatal Depression Scale Screening Tool 05/16/2021  I have been able to laugh and see the funny side of things. 0  I have looked forward with enjoyment to things. 0  I have blamed myself unnecessarily when things went wrong. 0  I have been anxious or worried for no good reason. 0  I have felt scared or panicky for no good reason. 0  Things have been getting on top of me. 0  I have been so unhappy that I have had difficulty sleeping. 0  I have felt sad or miserable. 0  I have been so unhappy that I have been crying. 0  The thought of harming myself has occurred to me. 0  Edinburgh Postnatal Depression Scale Total 0     After visit meds:  Allergies as of 05/17/2021   No Known Allergies      Medication List     STOP taking these medications    Blood Pressure Monitoring Devi       TAKE these medications    acetaminophen 325 MG tablet Commonly known as: Tylenol  Take 2 tablets (650 mg total) by mouth every 4 (four) hours as needed (for pain scale < 4).   ibuprofen 600 MG tablet Commonly known as: ADVIL Take 1 tablet (600 mg total) by mouth every 6 (six) hours as needed.   prenatal multivitamin Tabs tablet Take 1 tablet by mouth daily at 12 noon.         Discharge home in stable condition Infant Feeding: Bottle Infant Disposition:home with mother Discharge instruction: per After Visit Summary and Postpartum booklet. Activity: Advance as tolerated. Pelvic rest for 6 weeks.  Diet: routine diet Future Appointments: Future Appointments  Date Time Provider [place]  06/14/2021  1:35 PM Anyanwu, Sallyanne Havers, MD [facility]   Follow up Visit:  Myrtis Ser, CNM  P [facility]  Pool Please schedule this patient for Postpartum visit in: 4 weeks with the following provider: Any provider  In-Person  For C/S patients schedule nurse incision check in weeks 2 weeks: no  High risk pregnancy complicated by: Prev C/S  Delivery mode:  VBAC  Anticipated Birth Control:  Condoms  PP Procedures needed: none  Schedule Integrated BH visit: no   05/17/2021 Patriciaann Clan, DO

## 2021-05-16 NOTE — MAU Note (Signed)
.  Makayla Mcfarland is a 35 y.o. at [redacted]w[redacted]d here in MAU reporting: ctx every 20 minutes but started to have some vaginal bleeding this morning like a period. Denies LOF. Endorses good fetal movement.   Pain score: 1 Vitals:   05/16/21 0910  BP: 106/71  Pulse: 94  Resp: 15  Temp: 97.6 F (36.4 C)     FHT:140

## 2021-05-16 NOTE — H&P (Addendum)
OBSTETRIC ADMISSION HISTORY AND PHYSICAL  Makayla Mcfarland is a 35 y.o. female [phone] with IUP at [redacted]w[redacted]d by LMP presenting for SOL. She reports +FMs, No LOF, no VB, no blurry vision, headaches or peripheral edema, and RUQ pain.  She plans on breastfeeding. She request condoms for birth control. She received her prenatal care at  [facility]    Dating: By ultrasound on 12/12/20 --->  Estimated Date of Delivery: 05/27/21  Sono:    @[redacted]w[redacted]d , CWD, normal anatomy, cephalic presentation, 661 g, EFW   Prenatal History/Complications: language barrier (Mandarin speaker), fetal echogenic intracardiac focus on prenatal ultrasound (amniocentesis declined), history of prior cesarean section, and history of successful VBAC.   Past Medical History: History reviewed. No pertinent past medical history.  Past Surgical History: Past Surgical History:  Procedure Laterality Date   CESAREAN SECTION      Obstetrical History: OB History     Gravida  3   Para  2   Term  1   Preterm  1   AB  0   Living  2      SAB  0   IAB  0   Ectopic  0   Multiple  0   Live Births  2           Social History Social History   Socioeconomic History   Marital status: Married    Spouse name: Not on file   Number of children: Not on file   Years of education: Not on file   Highest education level: Not on file  Occupational History   Not on file  Tobacco Use   Smoking status: Never   Smokeless tobacco: Never  Vaping Use   Vaping Use: Never used  Substance and Sexual Activity   Alcohol use: No   Drug use: No   Sexual activity: Yes    Birth control/protection: None  Other Topics Concern   Not on file  Social History Narrative   Not on file   Social Determinants of Health   Financial Resource Strain: Not on file  Food Insecurity: No Food Insecurity   Worried About Running Out of Food in the Last Year: Never true   Ran Out of Food in the Last Year: Never true  Transportation Needs: No  Transportation Needs   Lack of Transportation (Medical): No   Lack of Transportation (Non-Medical): No  Physical Activity: Not on file  Stress: Not on file  Social Connections: Not on file    Family History: History reviewed. No pertinent family history.  Allergies: No Known Allergies  Medications Prior to Admission  Medication Sig Dispense Refill Last Dose   Prenatal Vit-Fe Fumarate-FA (PRENATAL MULTIVITAMIN) TABS tablet Take 1 tablet by mouth daily at 12 noon.   05/15/2021   Blood Pressure Monitoring DEVI 1 each by Does not apply route once a week. (Patient not taking: Reported on 05/15/2021) 1 each 0      Review of Systems   All systems reviewed and negative except as stated in HPI  Blood pressure 107/61, pulse 77, temperature 98 F (36.7 C), temperature source Oral, resp. rate 20, height 5\' 2"  (1.575 m), weight 73.5 kg, last menstrual period 07/30/2020. General appearance: alert and cooperative Lungs: clear to auscultation bilaterally Heart: regular rate and rhythm Abdomen: soft, non-tender; bowel sounds normal Pelvic: Scant blood, 6.5/80/-1 at initial presentation with rapid progression Extremities: Homans sign is negative, no sign of DVT Presentation:  cephalic Fetal monitoringBaseline: 145 bpm, Variability: Good {>  6 bpm), Accelerations: Reactive, and Decelerations: Absent Uterine activityFrequency: Every 3 minutes Dilation: 10 Effacement (%): 100 Station: Plus 1 Exam by:: Dr. Salvadore Dom   Prenatal labs: ABO, Rh: --/--/AB POS (10/25 0272) Antibody: NEG (10/25 0955) Rubella: 5.49 (03/28 0958) RPR: Non Reactive (08/08 0838)  HBsAg: Negative (03/28 0958)  HIV: Non Reactive (08/08 5366)  GBS: Negative/-- (10/17 1541)  1 hr Glucola 168 Genetic screening  NIPS low risk female, AFP negative, Horizon negative Anatomy US: isolated EIF noted  Prenatal Transfer Tool  Maternal Diabetes: No Genetic Screening: Normal Maternal Ultrasounds/Referrals: Isolated EIF  (echogenic intracardiac focus) Fetal Ultrasounds or other Referrals:  None Maternal Substance Abuse:  No Significant Maternal Medications:  None Significant Maternal Lab Results: Group B Strep negative  Results for orders placed or performed during the hospital encounter of 05/16/21 (from the past 24 hour(s))  Type and screen [facility]   Collection Time: 05/16/21  9:55 AM  Result Value Ref Range   ABO/RH(D) AB POS    Antibody Screen NEG    Sample Expiration      05/19/2021,2359 Performed at [facility], [address], [place], [place] [address]   Resp Panel by RT-PCR (Flu A&B, Covid) Nasopharyngeal Swab   Collection Time: 05/16/21 10:01 AM   Specimen: Nasopharyngeal Swab; Nasopharyngeal(NP) swabs in vial transport medium  Result Value Ref Range   SARS Coronavirus 2 by RT PCR NEGATIVE NEGATIVE   Influenza A by PCR NEGATIVE NEGATIVE   Influenza B by PCR NEGATIVE NEGATIVE  CBC   Collection Time: 05/16/21 10:09 AM  Result Value Ref Range   WBC 12.0 (H) 4.0 - 10.5 K/uL   RBC 4.10 3.87 - 5.11 MIL/uL   Hemoglobin 12.4 12.0 - 15.0 g/dL   HCT 74.2 59.5 - 63.8 %   MCV 91.7 80.0 - 100.0 fL   MCH 30.2 26.0 - 34.0 pg   MCHC 33.0 30.0 - 36.0 g/dL   RDW 75.6 43.3 - 29.5 %   Platelets 257 150 - 400 K/uL   nRBC 0.0 0.0 - 0.2 %   Patient Active Problem List   Diagnosis Date Noted   Uterine contractions 05/16/2021   Hx successful VBAC (vaginal birth after cesarean), currently pregnant 05/08/2021   Fetal echogenic intracardiac focus on prenatal ultrasound 01/30/2021   Language barrier 10/17/2020   History of cesarean section 10/17/2020   Supervision of other normal pregnancy, antepartum 10/06/2020   Assessment/Plan:  Makayla Mcfarland is a 35 y.o. G3P1102 at [redacted]w[redacted]d here for SOL.  #Labor: 6.5 cm on initial presentation, rapidly progressed to complete without augmentation.   #Pain: Not planning for epidural; breathing through contractions #FWB: Cat I,  reassuring #ID:  GBS negative #MOF: breast  #MOC: condoms per chart review; will need to confirm with mother before discharge #Circ:  N/A   [name], MD  05/16/2021, 2:40 PM  CNM attestation:  I have seen and examined this patient; I agree with above documentation in the resident's note.   Makayla Mcfarland is a 35 y.o. [phone] here for SOL; prev C/S followed by successful VBAC  PE: BP 93/63   Pulse 72   Temp 98 F (36.7 C) (Oral)   Resp 16   Ht 5\' 2"  (1.575 m)   Wt 73.5 kg   LMP 07/30/2020   SpO2 98%   Breastfeeding Unknown   BMI 29.63 kg/m  Gen: breathing w ctx; coping well Resp: normal effort, no distress Abd: gravid  ROS, labs, PMH  reviewed  Plan: Admit to Labor and Delivery Expectant management Desires TOLAC Anticipate successful VBAC  Arabella Merles CNM 05/16/2021, 6:18 PM

## 2021-05-17 ENCOUNTER — Other Ambulatory Visit (HOSPITAL_COMMUNITY): Payer: Self-pay

## 2021-05-17 LAB — RPR: RPR Ser Ql: NONREACTIVE

## 2021-05-17 MED ORDER — ACETAMINOPHEN 325 MG PO TABS: 650.0000 mg | ORAL_TABLET | ORAL | Status: AC | PRN

## 2021-05-17 MED ORDER — IBUPROFEN 600 MG PO TABS
600.0000 mg | ORAL_TABLET | Freq: Four times a day (QID) | ORAL | 0 refills | Status: AC | PRN
  Filled 2021-05-17: qty 60, 15d supply, fill #0

## 2021-05-17 NOTE — Progress Notes (Signed)
Post Partum Day 1 Subjective: Patient is recovering well and able to ambulate by herself. She is passing urine and flatus. She denies any headaches, vision changes, leg swelling/pain, abdominal pain, sob or chest pain.  Objective: Blood pressure 106/82, pulse 76, temperature 98.2 F (36.8 C), temperature source Oral, resp. rate 18, height 5\' 2"  (1.575 m), weight 73.5 kg, last menstrual period 07/30/2020, SpO2 99 %, unknown if currently breastfeeding.  Physical Exam:  General: alert and no distress Lochia: appropriate Uterine Fundus: firm DVT Evaluation: No evidence of DVT seen on physical exam.  Recent Labs    05/16/21 1009  HGB 12.4  HCT 37.6    Assessment/Plan: Makayla Mcfarland is a 35 y.o. 32 on PPD#1. She is recovering well. She is planning on bottle feeding her newborn, and wants to use condoms for birth control. -Discharge this afternoon, once baby is 24 hours   LOS: 1 day   [id number] 05/17/2021, 8:08 AM   Patient ID: 05/19/2021, female   DOB: 1985/09/29, 35 y.o.   MRN: [id number]

## 2021-05-26 ENCOUNTER — Encounter: Payer: Medicaid Other | Admitting: Obstetrics & Gynecology

## 2021-05-27 ENCOUNTER — Telehealth (HOSPITAL_COMMUNITY): Payer: Self-pay

## 2021-05-27 NOTE — Telephone Encounter (Signed)
No answer. Left message to return nurse call.  Marcelino [facility] 05/27/2021,1123

## 2021-06-02 ENCOUNTER — Encounter: Payer: Medicaid Other | Admitting: Obstetrics & Gynecology

## 2021-06-14 ENCOUNTER — Ambulatory Visit: Payer: Medicaid Other | Admitting: Obstetrics & Gynecology

## 2021-06-26 ENCOUNTER — Ambulatory Visit: Payer: Medicaid Other

## 2021-07-10 ENCOUNTER — Ambulatory Visit: Payer: Medicaid Other | Admitting: Obstetrics and Gynecology

## 2021-07-27 ENCOUNTER — Ambulatory Visit: Payer: Medicaid Other | Admitting: Family Medicine

## 2021-08-07 ENCOUNTER — Other Ambulatory Visit: Payer: Self-pay

## 2021-08-07 ENCOUNTER — Ambulatory Visit (INDEPENDENT_AMBULATORY_CARE_PROVIDER_SITE_OTHER): Payer: Medicaid Other | Admitting: Obstetrics and Gynecology

## 2021-08-07 NOTE — Progress Notes (Signed)
° ° °  Post Partum Visit Note  Estella Jamerica Snavely is a 36 y.o. [id number] s/p VBAC/2nd degree at [redacted]w[redacted]d on 10/25. Anesthesia: epidural. Postpartum course has been going well per patient. Baby is doing well per patient. Baby is feeding by bottle - Similac Advance. Bleeding no bleeding. Bowel function is normal. Bladder function is normal. Patient is sexually active. Contraception method is condoms. Postpartum depression screening: negative.  No issues with sexual intercourse, LMP in December.  Patient never breastfed and breasts are back down to regular size but sometimes still gets b/l breast milks (clear or white) with hot showers and when she washes. She has tried tight fitting garments to see if that will help. She denies any breast lumps, bumps, engorgement.   Edinburgh Postnatal Depression Scale - 08/07/21 1447       Edinburgh Postnatal Depression Scale:  In the Past 7 Days   I have been able to laugh and see the funny side of things. 0    I have looked forward with enjoyment to things. 0    I have blamed myself unnecessarily when things went wrong. 0    I have been anxious or worried for no good reason. 0    I have felt scared or panicky for no good reason. 0    Things have been getting on top of me. 0    I have been so unhappy that I have had difficulty sleeping. 0    I have felt sad or miserable. 0    I have been so unhappy that I have been crying. 0    The thought of harming myself has occurred to me. 0    Edinburgh Postnatal Depression Scale Total 0              Review of Systems Pertinent items noted in HPI and remainder of comprehensive ROS otherwise negative.  Objective:  BP 111/86    Pulse 83    Wt 139 lb 3.2 oz (63.1 kg)    LMP 06/23/2020    Breastfeeding No    BMI 25.46 kg/m    NAD  Assessment:   Normal postpartum visit.   Plan:  I told her if she's interested in anything else for birth control to let [place] know. I also told her that I would give it six months to see  if the milk dries up before doing a work up, as long as she has no breast s/s and the milk looks white/clear. I sent a messaged to [facility] to see if they have any other thoughts.  Diagnosis  Date Value Ref Range Status  10/17/2020   Final   - Negative for intraepithelial lesion or malignancy (NILM)   Patient has a h/o irregular periods (may go 1-3 months before having a period) but were normal after her last pregnancy. Pt told to let us know if periods become irregular after this pregnancy  Patient declined interpreter today  RTC: PRN  Rowlett Bing, MD Center for [facility], [facility]

## 2021-08-08 ENCOUNTER — Telehealth: Payer: Self-pay | Admitting: Lactation Services

## 2021-08-08 NOTE — Telephone Encounter (Signed)
-----   Message from [place] Bing, MD sent at 08/07/2021  3:19 PM EST ----- Regarding: tips to get breast milk to dry up? 4m PP and never breastfed. Still having some from both sides (see my note from today)  Thanks!

## 2021-08-08 NOTE — Telephone Encounter (Signed)
Called patient , phone went straight to voicemail. Per chart review, patient did not need interpreter. LM for patient to call the office at [phone] to leave a message for Lactation and will call her back. Will try to call patient at a later time.

## 2021-08-09 NOTE — Telephone Encounter (Signed)
Attempted to Call patient with [employer], no interpreter picked up.   Called patient without interpreter and at first reached patients husband and then called patients mobile number and was able to reach her.   Patient reports she has been having some breast milk when touching the breast and when washing nipples in the shower. It is small amounts "a little bit". She is not trying to express the milk out.   Reviewed it can take some women a while to dry up milk completely and that some women can leak for several months.   Reviewed trying one or more of the following:   No nipple stimulation at all for 2 weeks Firm supportive bra Cold crushed Cabbage leaves inside bra and change when wilted for 2-3 days Sudafed 12 hour every 12 hours for 3 days Sage Tea Altoids orally or Peppermint Oils to breast (do not allow infant to touch)   Patient advised to call back or to send in Mychart message if no improvement in the next few weeks.   Reviewed if does not dry up at about 6 months post partum may need Prolactin levels checked.
